# Patient Record
Sex: Female | Born: 1985 | Race: Black or African American | Hispanic: No | Marital: Single | Smoking: Current every day smoker
Health system: Southern US, Community
[De-identification: ages and names within clinical notes are randomized; demographics above are authoritative.]

## PROBLEM LIST (undated history)

## (undated) DIAGNOSIS — Z8673 Personal history of transient ischemic attack (TIA), and cerebral infarction without residual deficits: Secondary | ICD-10-CM

## (undated) DIAGNOSIS — I10 Essential (primary) hypertension: Secondary | ICD-10-CM

## (undated) DIAGNOSIS — M797 Fibromyalgia: Secondary | ICD-10-CM

## (undated) HISTORY — PX: NO PAST SURGERIES: SHX2092

---

## 2014-02-23 ENCOUNTER — Emergency Department (HOSPITAL_COMMUNITY): Payer: Medicaid - Out of State

## 2014-02-23 ENCOUNTER — Inpatient Hospital Stay (HOSPITAL_COMMUNITY)
Admission: EM | Admit: 2014-02-23 | Discharge: 2014-02-25 | DRG: 690 | Disposition: A | Discharge status: Home / Self Care | Payer: Medicaid - Out of State | Attending: Internal Medicine | Admitting: Internal Medicine

## 2014-02-23 ENCOUNTER — Encounter (HOSPITAL_COMMUNITY): Payer: Self-pay | Admitting: Emergency Medicine

## 2014-02-23 DIAGNOSIS — R1030 Lower abdominal pain, unspecified: Secondary | ICD-10-CM | POA: Diagnosis present

## 2014-02-23 DIAGNOSIS — Z975 Presence of (intrauterine) contraceptive device: Secondary | ICD-10-CM

## 2014-02-23 DIAGNOSIS — Z886 Allergy status to analgesic agent status: Secondary | ICD-10-CM

## 2014-02-23 DIAGNOSIS — F17210 Nicotine dependence, cigarettes, uncomplicated: Secondary | ICD-10-CM | POA: Diagnosis present

## 2014-02-23 DIAGNOSIS — R1031 Right lower quadrant pain: Secondary | ICD-10-CM

## 2014-02-23 DIAGNOSIS — I10 Essential (primary) hypertension: Secondary | ICD-10-CM | POA: Diagnosis present

## 2014-02-23 DIAGNOSIS — R109 Unspecified abdominal pain: Secondary | ICD-10-CM | POA: Diagnosis present

## 2014-02-23 DIAGNOSIS — A5611 Chlamydial female pelvic inflammatory disease: Principal | ICD-10-CM | POA: Diagnosis present

## 2014-02-23 DIAGNOSIS — Z23 Encounter for immunization: Secondary | ICD-10-CM

## 2014-02-23 DIAGNOSIS — Z79899 Other long term (current) drug therapy: Secondary | ICD-10-CM | POA: Diagnosis not present

## 2014-02-23 DIAGNOSIS — Z8249 Family history of ischemic heart disease and other diseases of the circulatory system: Secondary | ICD-10-CM | POA: Diagnosis not present

## 2014-02-23 DIAGNOSIS — N730 Acute parametritis and pelvic cellulitis: Secondary | ICD-10-CM | POA: Diagnosis present

## 2014-02-23 DIAGNOSIS — F1290 Cannabis use, unspecified, uncomplicated: Secondary | ICD-10-CM | POA: Diagnosis present

## 2014-02-23 HISTORY — DX: Essential (primary) hypertension: I10

## 2014-02-23 LAB — CBC WITH DIFFERENTIAL/PLATELET
BASOS ABS: 0 10*3/uL (ref 0.0–0.1)
BASOS PCT: 0 % (ref 0–1)
EOS ABS: 0 10*3/uL (ref 0.0–0.7)
Eosinophils Relative: 0 % (ref 0–5)
HCT: 38.5 % (ref 36.0–46.0)
HEMOGLOBIN: 12.6 g/dL (ref 12.0–15.0)
Lymphocytes Relative: 9 % — ABNORMAL LOW (ref 12–46)
Lymphs Abs: 1.2 10*3/uL (ref 0.7–4.0)
MCH: 29.9 pg (ref 26.0–34.0)
MCHC: 32.7 g/dL (ref 30.0–36.0)
MCV: 91.4 fL (ref 78.0–100.0)
MONO ABS: 0.4 10*3/uL (ref 0.1–1.0)
MONOS PCT: 3 % (ref 3–12)
Neutro Abs: 11.1 10*3/uL — ABNORMAL HIGH (ref 1.7–7.7)
Neutrophils Relative %: 88 % — ABNORMAL HIGH (ref 43–77)
Platelets: 264 10*3/uL (ref 150–400)
RBC: 4.21 MIL/uL (ref 3.87–5.11)
RDW: 13.5 % (ref 11.5–15.5)
WBC: 12.7 10*3/uL — ABNORMAL HIGH (ref 4.0–10.5)

## 2014-02-23 LAB — COMPREHENSIVE METABOLIC PANEL
ALBUMIN: 3.4 g/dL — AB (ref 3.5–5.2)
ALT: 9 U/L (ref 0–35)
AST: 13 U/L (ref 0–37)
Alkaline Phosphatase: 74 U/L (ref 39–117)
Anion gap: 13 (ref 5–15)
BUN: 11 mg/dL (ref 6–23)
CO2: 23 mEq/L (ref 19–32)
CREATININE: 0.74 mg/dL (ref 0.50–1.10)
Calcium: 9 mg/dL (ref 8.4–10.5)
Chloride: 102 mEq/L (ref 96–112)
GFR calc Af Amer: >90 mL/min (ref >90)
GFR calc non Af Amer: >90 mL/min (ref >90)
Glucose, Bld: 102 mg/dL — ABNORMAL HIGH (ref 70–99)
Potassium: 3.5 mEq/L — ABNORMAL LOW (ref 3.7–5.3)
Sodium: 138 mEq/L (ref 137–147)
TOTAL PROTEIN: 6.9 g/dL (ref 6.0–8.3)
Total Bilirubin: 1.3 mg/dL — ABNORMAL HIGH (ref 0.3–1.2)

## 2014-02-23 LAB — URINE MICROSCOPIC-ADD ON

## 2014-02-23 LAB — LIPASE, BLOOD: LIPASE: 11 U/L (ref 11–59)

## 2014-02-23 LAB — URINALYSIS, ROUTINE W REFLEX MICROSCOPIC
GLUCOSE, UA: NEGATIVE mg/dL
Hgb urine dipstick: NEGATIVE
KETONES UR: 40 mg/dL — AB
Nitrite: NEGATIVE
PROTEIN: 30 mg/dL — AB
Specific Gravity, Urine: 1.043 — ABNORMAL HIGH (ref 1.005–1.030)
UROBILINOGEN UA: 1 mg/dL (ref 0.0–1.0)
pH: 7 (ref 5.0–8.0)

## 2014-02-23 LAB — RAPID HIV SCREEN (WH-MAU): SUDS RAPID HIV SCREEN: NONREACTIVE

## 2014-02-23 LAB — WET PREP, GENITAL
TRICH WET PREP: NONE SEEN
Yeast Wet Prep HPF POC: NONE SEEN

## 2014-02-23 LAB — PREGNANCY, URINE: PREG TEST UR: NEGATIVE

## 2014-02-23 MED ORDER — SODIUM CHLORIDE 0.9 % IV BOLUS (SEPSIS)
1000.0000 mL | Freq: Once | INTRAVENOUS | Status: AC
  Administered 2014-02-23: 1000 mL via INTRAVENOUS

## 2014-02-23 MED ORDER — IOHEXOL 300 MG/ML  SOLN
100.0000 mL | Freq: Once | INTRAMUSCULAR | Status: AC | PRN
  Administered 2014-02-23: 100 mL via INTRAVENOUS

## 2014-02-23 MED ORDER — SODIUM CHLORIDE 0.9 % IV SOLN
3.0000 g | Freq: Three times a day (TID) | INTRAVENOUS | Status: DC
  Administered 2014-02-24: 3 g via INTRAVENOUS
  Filled 2014-02-23 (×2): qty 3

## 2014-02-23 MED ORDER — STERILE WATER FOR INJECTION IJ SOLN
INTRAMUSCULAR | Status: AC
  Administered 2014-02-23: 10 mL
  Filled 2014-02-23: qty 10

## 2014-02-23 MED ORDER — INFLUENZA VAC SPLIT QUAD 0.5 ML IM SUSY
0.5000 mL | PREFILLED_SYRINGE | INTRAMUSCULAR | Status: AC
  Administered 2014-02-25: 0.5 mL via INTRAMUSCULAR
  Filled 2014-02-23 (×3): qty 0.5

## 2014-02-23 MED ORDER — ONDANSETRON HCL 4 MG/2ML IJ SOLN: 4.0000 mg | Freq: Four times a day (QID) | INTRAMUSCULAR | Status: DC | PRN

## 2014-02-23 MED ORDER — ZOLPIDEM TARTRATE 5 MG PO TABS: 5.0000 mg | ORAL_TABLET | Freq: Every evening | ORAL | Status: DC | PRN

## 2014-02-23 MED ORDER — HYDROMORPHONE HCL 1 MG/ML IJ SOLN
1.0000 mg | INTRAMUSCULAR | Status: DC | PRN
  Administered 2014-02-23 – 2014-02-24 (×6): 1 mg via INTRAVENOUS
  Filled 2014-02-23 (×6): qty 1

## 2014-02-23 MED ORDER — ONDANSETRON HCL 4 MG PO TABS: 4.0000 mg | ORAL_TABLET | Freq: Four times a day (QID) | ORAL | Status: DC | PRN

## 2014-02-23 MED ORDER — CEFTRIAXONE SODIUM 250 MG IJ SOLR
250.0000 mg | Freq: Once | INTRAMUSCULAR | Status: AC
  Administered 2014-02-23: 250 mg via INTRAMUSCULAR
  Filled 2014-02-23: qty 250

## 2014-02-23 MED ORDER — DOXYCYCLINE HYCLATE 100 MG IV SOLR
100.0000 mg | Freq: Two times a day (BID) | INTRAVENOUS | Status: DC
  Administered 2014-02-24 – 2014-02-25 (×3): 100 mg via INTRAVENOUS
  Filled 2014-02-23 (×3): qty 100

## 2014-02-23 MED ORDER — ONDANSETRON HCL 4 MG/2ML IJ SOLN
4.0000 mg | Freq: Once | INTRAMUSCULAR | Status: AC
  Administered 2014-02-23: 4 mg via INTRAVENOUS
  Filled 2014-02-23: qty 2

## 2014-02-23 MED ORDER — HYDROMORPHONE HCL 1 MG/ML IJ SOLN
1.0000 mg | Freq: Once | INTRAMUSCULAR | Status: AC
  Administered 2014-02-23: 1 mg via INTRAVENOUS
  Filled 2014-02-23: qty 1

## 2014-02-23 MED ORDER — ENOXAPARIN SODIUM 40 MG/0.4ML ~~LOC~~ SOLN
40.0000 mg | Freq: Every day | SUBCUTANEOUS | Status: DC
  Administered 2014-02-24 (×2): 40 mg via SUBCUTANEOUS
  Filled 2014-02-23 (×3): qty 0.4

## 2014-02-23 MED ORDER — ENOXAPARIN SODIUM 40 MG/0.4ML ~~LOC~~ SOLN: 40.0000 mg | SUBCUTANEOUS | Status: DC

## 2014-02-23 MED ORDER — DOXYCYCLINE HYCLATE 100 MG PO TABS
100.0000 mg | ORAL_TABLET | Freq: Once | ORAL | Status: AC
  Administered 2014-02-23: 100 mg via ORAL
  Filled 2014-02-23: qty 1

## 2014-02-23 MED ORDER — MORPHINE SULFATE 2 MG/ML IJ SOLN
2.0000 mg | INTRAMUSCULAR | Status: DC | PRN
  Administered 2014-02-24 (×3): 2 mg via INTRAVENOUS
  Filled 2014-02-23 (×3): qty 1

## 2014-02-23 MED ORDER — SODIUM CHLORIDE 0.9 % IV SOLN
3.0000 g | Freq: Once | INTRAVENOUS | Status: AC
  Administered 2014-02-23: 3 g via INTRAVENOUS
  Filled 2014-02-23 (×2): qty 3

## 2014-02-23 MED ORDER — PNEUMOCOCCAL VAC POLYVALENT 25 MCG/0.5ML IJ INJ
0.5000 mL | INJECTION | INTRAMUSCULAR | Status: AC
  Administered 2014-02-25: 0.5 mL via INTRAMUSCULAR
  Filled 2014-02-23 (×3): qty 0.5

## 2014-02-23 MED ORDER — SODIUM CHLORIDE 0.9 % IV SOLN
INTRAVENOUS | Status: DC
  Administered 2014-02-24: 01:00:00 via INTRAVENOUS

## 2014-02-23 MED ORDER — METRONIDAZOLE 500 MG PO TABS
500.0000 mg | ORAL_TABLET | Freq: Once | ORAL | Status: AC
  Administered 2014-02-23: 500 mg via ORAL
  Filled 2014-02-23: qty 1

## 2014-02-23 MED ORDER — IOHEXOL 300 MG/ML  SOLN
50.0000 mL | Freq: Once | INTRAMUSCULAR | Status: AC | PRN
  Administered 2014-02-23: 50 mL via ORAL

## 2014-02-23 NOTE — Progress Notes (Addendum)
[facility]                                                                                                                                                                  H&P             Patient Demographics  Anna Horton, is a 28 y.o. female  CSN: [id number]  MRN: [id number]  DOB - 07-Dec-1985  Admit Date - 02/23/2014  Outpatient Primary MD for the patient is No primary provider on file.   With History of -  Past Medical History  Diagnosis Date  . Hypertension       Past Surgical History  Procedure Laterality Date  . No past surgeries      in for   Chief Complaint  Patient presents with  . Abdominal Pain     HPI  Anna Horton  is a 28 y.o. female, with past medical history significant for hypertension presenting with 2 days history of lower abdominal pain and fever . Patient denies any nausea vomiting or diarrhea. Patient denies any history vaginal discharge or PID lately . Patient has an IUD in place . She was evaluated by general surgery in the emergency room because of an abnormality noted around her appendix, and they advised admission for possible PID or evolving appendicitis. Patient received IV antibiotics in the emergency room and I was called to admit. Patient moved recently from [place] and she was treated in August for gastroenteritis there    Review of Systems    In addition to the HPI above,   No Headache, No changes with Vision or hearing, No problems swallowing food or Liquids, No Chest pain, Cough or Shortness of Breath,  No Nausea or Vommitting, Bowel movements are regular, No Blood in stool or Urine, No dysuria, No new skin rashes or bruises, No new joints pains-aches,  No new weakness, tingling, numbness in any extremity, No recent weight gain or loss, No polyuria, polydypsia or polyphagia, No significant Mental Stressors.  A full 10 point Review of Systems was done, except as stated above, all other Review of Systems  were negative.   Social History History  Substance Use Topics  . Smoking status: Current Every Day Smoker -- 0.50 packs/day for 10 years    Types: Cigarettes  . Smokeless tobacco: Never Used  . Alcohol Use: Yes     Comment: socially     Family History Significant for hypertension  Prior to Admission medications   Medication Sig Start Date End Date Taking? Authorizing Provider  acetaminophen (TYLENOL) 500 MG tablet Take 1,000 mg by mouth every 6 (six) hours as needed for mild pain or headache.   Yes Historical Provider, MD  levonorgestrel (MIRENA) 20 MCG/24HR IUD 1 each  by Intrauterine route once. October 2011   Yes Historical Provider, MD    Allergies  Allergen Reactions  . Tramadol Anaphylaxis    "makes my throat close"    Physical Exam  Vitals  Blood pressure 130/68, pulse 90, temperature 101.1 F (38.4 C), temperature source Oral, resp. rate 18, height 5\' 3"  (1.6 m), weight 63.504 kg (140 lb), last menstrual period 02/16/2014, SpO2 99.00%.   1. General Young female in pain  2. Normal affect and insight, Not Suicidal or Homicidal, Awake Alert, Oriented X 3.  3. No F.N deficits, ALL C.Nerves Intact,   4. Ears and Eyes appear Normal, Conjunctivae clear, PERRLA. Moist Oral Mucosa.  5. Supple Neck, No JVD, No cervical lymphadenopathy appriciated, No Carotid Bruits.  6. Symmetrical Chest wall movement, Good air movement bilaterally, CTAB.  7. RRR, No Gallops, Rubs or Murmurs, No Parasternal Heave.  8. Positive Bowel Sounds, lower abdominal tenderness with rebound both lower quadrants, no rigidity. No upper abdominal direct tenderness  9.  No Cyanosis, Normal Skin Turgor, No Skin Rash or Bruise.  10. Good muscle tone,  joints appear normal , no effusions, Normal ROM.  11. No Palpable Lymph Nodes in Neck or Axillae    Data Review  CBC  Recent Labs Lab 02/23/14 1535  WBC 12.7*  HGB 12.6  HCT 38.5  PLT 264  MCV 91.4  MCH 29.9  MCHC 32.7  RDW 13.5   LYMPHSABS 1.2  MONOABS 0.4  EOSABS 0.0  BASOSABS 0.0   ------------------------------------------------------------------------------------------------------------------  Chemistries   Recent Labs Lab 02/23/14 1535  NA 138  K 3.5*  CL 102  CO2 23  GLUCOSE 102*  BUN 11  CREATININE 0.74  CALCIUM 9.0  AST 13  ALT 9  ALKPHOS 74  BILITOT 1.3*   ------------------------------------------------------------------------------------------------------------------ estimated creatinine clearance is 95.6 ml/min (by C-G formula based on Cr of 0.74). ------------------------------------------------------------------------------------------------------------------ No results found for this basename: TSH, T4TOTAL, FREET3, T3FREE, THYROIDAB,  in the last 72 hours   Coagulation profile No results found for this basename: INR, PROTIME,  in the last 168 hours ------------------------------------------------------------------------------------------------------------------- No results found for this basename: DDIMER,  in the last 72 hours -------------------------------------------------------------------------------------------------------------------  Cardiac Enzymes No results found for this basename: CK, CKMB, TROPONINI, MYOGLOBIN,  in the last 168 hours ------------------------------------------------------------------------------------------------------------------ No components found with this basename: POCBNP,    ---------------------------------------------------------------------------------------------------------------  Urinalysis    Component Value Date/Time   COLORURINE ORANGE* 02/23/2014 1549   APPEARANCEUR CLEAR 02/23/2014 1549   LABSPEC 1.043* 02/23/2014 1549   PHURINE 7.0 02/23/2014 1549   GLUCOSEU NEGATIVE 02/23/2014 1549   HGBUR NEGATIVE 02/23/2014 1549   BILIRUBINUR SMALL* 02/23/2014 1549   KETONESUR 40* 02/23/2014 1549   PROTEINUR 30* 02/23/2014 1549   UROBILINOGEN  1.0 02/23/2014 1549   NITRITE NEGATIVE 02/23/2014 1549   LEUKOCYTESUR SMALL* 02/23/2014 1549    ----------------------------------------------------------------------------------------------------------------     Imaging results:   Ct Abdomen Pelvis W Contrast  02/23/2014   CLINICAL DATA:  Right lower quadrant pain for 2 days.  Dysuria.  EXAM: CT ABDOMEN AND PELVIS WITH CONTRAST  TECHNIQUE: Multidetector CT imaging of the abdomen and pelvis was performed using the standard protocol following bolus administration of intravenous contrast.  CONTRAST:  50mL OMNIPAQUE IOHEXOL 300 MG/ML SOLN, 100mL OMNIPAQUE IOHEXOL 300 MG/ML SOLN  COMPARISON:  None.  FINDINGS: The appendix is visualized and appears slightly enlarged with a diameter of 9.2 mm. However, there is no periappendiceal inflammation. The terminal ileum appears normal.  Liver, biliary tree, spleen, pancreas,  and left kidney are normal. In There is a 6 mm low-density lesion in the lateral aspect of the lower pole of the right kidney which probably represents small cyst but is too small to definitively characterize.  The ovaries are identified and appear normal. Tiny amount of free fluid in the pelvic cul-de-sac. IUD is in place in the uterus.  There is slight haziness in the peritoneal fat in the pelvis, nonspecific.  No acute osseous abnormality. 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  IMPRESSION: 1. Diameter of the proximal portion of the appendix is slightly prominent at 9.2 mm but there is no periappendiceal inflammation. 2. Slight haziness of the peritoneal fat in the pelvis, nonspecific. Tiny amount of free fluid in the pelvic cul-de-sac, normal for a female of this age.   Electronically Signed   By: Geanie Cooley M.D.   On: 02/23/2014 18:35      Assessment & Plan  1. lower abdominal pain, tenderness and fever    PID versus evolving appendicitis    Patient has an IUD placed in October 2012    Chandeliers  positive according to ER physician    General surgery following     Unasyn and doxycycline IV    N.p.o. after midnight    Consults OB/GYN in a.m.  2. History of hypertension     Continue with by mouth medications with sips of water   DVT Prophylaxis Lovenox  AM Labs Ordered, also please review Full Orders  Code Status full  Disposition Plan: Home  Time spent in minutes : 32 minutes  Condition GUARDED    @SIGNATURE @

## 2014-02-23 NOTE — Consult Note (Signed)
Reason for Consult: Lower abdominal pain Referring Physician: Dr. Christinia Gully Anna Horton is an 28 y.o. female.  HPI: she began having some lower abdominal pain during intercourse 2 days ago. The pain progressively worsened. It was crampy and now a sharp in nature. It seems to be most uncomfortable in the right lower quadrant area. She denies any vaginal discharge. She's had irregular periods and is currently menstruating. No diarrhea. No dysuria. No fever or chills. No loss of appetite. She presented to the emergency department for evaluation. During her pelvic exam, she is noted to have some friability of the cervix as well as tenderness in the lower abdomen on exam. CT scan demonstrated the base of the appendix to be approximately 9 mm which is slightly larger than normal. However, there were no inflammatory changes present. Because of the CT findings, I was asked to see her to evaluate her for possible acute appendicitis.  She is from [place]. She stated she was in the hospital for approximately a week back in August because of some gastroenteritis.  Past Medical History  Diagnosis Date  . Hypertension     No past surgical history on file.  No family history on file.  Social History:  reports that she has been smoking Cigarettes.  She has been smoking about 0.50 packs per day. She does not have any smokeless tobacco history on file. She reports that she drinks alcohol. She reports that she uses illicit drugs (Marijuana).  Allergies:  Allergies  Allergen Reactions  . Tramadol Anaphylaxis    "makes my throat close"    Prior to Admission medications   Medication Sig Start Date End Date Taking? Authorizing Provider  acetaminophen (TYLENOL) 500 MG tablet Take 1,000 mg by mouth every 6 (six) hours as needed for mild pain or headache.   Yes Historical Provider, MD  levonorgestrel (MIRENA) 20 MCG/24HR IUD 1 each by Intrauterine route once. October 2011   Yes Historical Provider, MD      Results for orders placed during the hospital encounter of 02/23/14 (from the past 48 hour(s))  CBC WITH DIFFERENTIAL     Status: Abnormal   Collection Time    02/23/14  3:35 PM      Result Value Ref Range   WBC 12.7 (*) 4.0 - 10.5 K/uL   RBC 4.21  3.87 - 5.11 MIL/uL   Hemoglobin 12.6  12.0 - 15.0 g/dL   HCT 38.5  36.0 - 46.0 %   MCV 91.4  78.0 - 100.0 fL   MCH 29.9  26.0 - 34.0 pg   MCHC 32.7  30.0 - 36.0 g/dL   RDW 13.5  11.5 - 15.5 %   Platelets 264  150 - 400 K/uL   Neutrophils Relative % 88 (*) 43 - 77 %   Neutro Abs 11.1 (*) 1.7 - 7.7 K/uL   Lymphocytes Relative 9 (*) 12 - 46 %   Lymphs Abs 1.2  0.7 - 4.0 K/uL   Monocytes Relative 3  3 - 12 %   Monocytes Absolute 0.4  0.1 - 1.0 K/uL   Eosinophils Relative 0  0 - 5 %   Eosinophils Absolute 0.0  0.0 - 0.7 K/uL   Basophils Relative 0  0 - 1 %   Basophils Absolute 0.0  0.0 - 0.1 K/uL  COMPREHENSIVE METABOLIC PANEL     Status: Abnormal   Collection Time    02/23/14  3:35 PM      Result Value Ref Range  Sodium 138  137 - 147 mEq/L   Potassium 3.5 (*) 3.7 - 5.3 mEq/L   Chloride 102  96 - 112 mEq/L   CO2 23  19 - 32 mEq/L   Glucose, Bld 102 (*) 70 - 99 mg/dL   BUN 11  6 - 23 mg/dL   Creatinine, Ser 0.74  0.50 - 1.10 mg/dL   Calcium 9.0  8.4 - 10.5 mg/dL   Total Protein 6.9  6.0 - 8.3 g/dL   Albumin 3.4 (*) 3.5 - 5.2 g/dL   AST 13  0 - 37 U/L   ALT 9  0 - 35 U/L   Alkaline Phosphatase 74  39 - 117 U/L   Total Bilirubin 1.3 (*) 0.3 - 1.2 mg/dL   GFR calc non Af Amer >90  >90 mL/min   GFR calc Af Amer >90  >90 mL/min   Comment: (NOTE)     The eGFR has been calculated using the CKD EPI equation.     This calculation has not been validated in all clinical situations.     eGFR's persistently <90 mL/min signify possible Chronic Kidney     Disease.   Anion gap 13  5 - 15  LIPASE, BLOOD     Status: None   Collection Time    02/23/14  3:35 PM      Result Value Ref Range   Lipase 11  11 - 59 U/L  RAPID HIV SCREEN  [facility])     Status: None   Collection Time    02/23/14  3:35 PM      Result Value Ref Range   SUDS Rapid HIV Screen NON REACTIVE  NON REACTIVE   Comment: RESULT CALLED TO, READ BACK BY AND VERIFIED WITH:     HAMBY MARTHA RN 1937 02/23/14 [place]  URINALYSIS, ROUTINE W REFLEX MICROSCOPIC     Status: Abnormal   Collection Time    02/23/14  3:49 PM      Result Value Ref Range   Color, Urine ORANGE (*) YELLOW   Comment: BIOCHEMICALS MAY BE AFFECTED BY COLOR   APPearance CLEAR  CLEAR   Specific Gravity, Urine 1.043 (*) 1.005 - 1.030   pH 7.0  5.0 - 8.0   Glucose, UA NEGATIVE  NEGATIVE mg/dL   Hgb urine dipstick NEGATIVE  NEGATIVE   Bilirubin Urine SMALL (*) NEGATIVE   Ketones, ur 40 (*) NEGATIVE mg/dL   Protein, ur 30 (*) NEGATIVE mg/dL   Urobilinogen, UA 1.0  0.0 - 1.0 mg/dL   Nitrite NEGATIVE  NEGATIVE   Leukocytes, UA SMALL (*) NEGATIVE  PREGNANCY, URINE     Status: None   Collection Time    02/23/14  3:49 PM      Result Value Ref Range   Preg Test, Ur NEGATIVE  NEGATIVE   Comment:            THE SENSITIVITY OF THIS     METHODOLOGY IS >20 mIU/mL.  URINE MICROSCOPIC-ADD ON     Status: Abnormal   Collection Time    02/23/14  3:49 PM      Result Value Ref Range   Squamous Epithelial / LPF FEW (*) RARE   WBC, UA 0-2  <3 WBC/hpf   Bacteria, UA RARE  RARE   Casts HYALINE CASTS (*) NEGATIVE   Urine-Other MUCOUS PRESENT    WET PREP, GENITAL     Status: Abnormal   Collection Time    02/23/14  4:13 PM  Result Value Ref Range   Yeast Wet Prep HPF POC NONE SEEN  NONE SEEN   Trich, Wet Prep NONE SEEN  NONE SEEN   Clue Cells Wet Prep HPF POC MODERATE (*) NONE SEEN   WBC, Wet Prep HPF POC MODERATE (*) NONE SEEN    Ct Abdomen Pelvis W Contrast  02/23/2014   CLINICAL DATA:  Right lower quadrant pain for 2 days.  Dysuria.  EXAM: CT ABDOMEN AND PELVIS WITH CONTRAST  TECHNIQUE: Multidetector CT imaging of the abdomen and pelvis was performed using the standard protocol  following bolus administration of intravenous contrast.  CONTRAST:  73mL OMNIPAQUE IOHEXOL 300 MG/ML SOLN, 189mL OMNIPAQUE IOHEXOL 300 MG/ML SOLN  COMPARISON:  None.  FINDINGS: The appendix is visualized and appears slightly enlarged with a diameter of 9.2 mm. However, there is no periappendiceal inflammation. The terminal ileum appears normal.  Liver, biliary tree, spleen, pancreas, and left kidney are normal. In There is a 6 mm low-density lesion in the lateral aspect of the lower pole of the right kidney which probably represents small cyst but is too small to definitively characterize.  The ovaries are identified and appear normal. Tiny amount of free fluid in the pelvic cul-de-sac. IUD is in place in the uterus.  There is slight haziness in the peritoneal fat in the pelvis, nonspecific.  No acute osseous abnormality. 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  IMPRESSION: 1. Diameter of the proximal portion of the appendix is slightly prominent at 9.2 mm but there is no periappendiceal inflammation. 2. Slight haziness of the peritoneal fat in the pelvis, nonspecific. Tiny amount of free fluid in the pelvic cul-de-sac, normal for a female of this age.   Electronically Signed   By: Rozetta Nunnery M.D.   On: 02/23/2014 18:35    Review of Systems  Constitutional: Negative for fever and chills.  HENT: Negative for congestion.   Respiratory: Negative.   Gastrointestinal: Positive for abdominal pain. Negative for nausea, vomiting and diarrhea.  Genitourinary: Negative for dysuria.   Blood pressure 130/68, pulse 90, temperature 101.1 F (38.4 C), temperature source Oral, resp. rate 18, height $RemoveBe'5\' 3"'[id number]$  (1.6 m), weight 140 lb (63.504 kg), last menstrual period 02/16/2014, SpO2 99.00%. Physical Exam  Constitutional:  Uncomfortable appearing female.  HENT:  Head: Normocephalic and atraumatic.  Eyes: No scleral icterus.  Neck: Neck supple.  Cardiovascular:  Increased rate.   Respiratory: Effort normal.  GI: Soft. Bowel sounds are normal. She exhibits no mass. There is tenderness (Throughout lower abdomen to palpation and percussion).  Musculoskeletal: She exhibits no edema.  Lymphadenopathy:    She has no cervical adenopathy.  Neurological: She is alert.  Skin: Skin is warm and dry.  Psychiatric: She has a normal mood and affect. Her behavior is normal.    Assessment/Plan: Lower abdominal pain following intercourse. CT scan demonstrates slight dilation of the proximal appendix with no inflammatory findings. Her clinical history and exam are more consistent with pelvic inflammatory disease rather than acute appendicitis although cannot completely rule appendicitis.  Recommendation: She should be admitted to the hospital and started on appropriate antibiotic therapy for pelvic inflammatory disease. If her condition does not improve or gets worse despite this, I would recommend a diagnostic laparoscopy and possible appendectomy.  Kloey Cazarez J 02/23/2014, 9:03 PM

## 2014-02-23 NOTE — ED Notes (Addendum)
Pt c/o RLQ pain x last 2 days.  Denies N/V/D.  States she had chills last night. States she had intercourse 2 days ago and not sure if her IUD has moved.  Also c/o dysuria but "not like a UTI, it's like peeing over a cut"   Pt guards abdomen and walks hunched over

## 2014-02-23 NOTE — ED Provider Notes (Signed)
CSN: [id number]     Arrival date & time 02/23/14  1422 History   First MD Initiated Contact with Patient 02/23/14 1459     Chief Complaint  Patient presents with  . Abdominal Pain     (Consider location/radiation/quality/duration/timing/severity/associated sxs/prior Treatment) Patient is a 27 y.o. female presenting with abdominal pain.  Abdominal Pain Associated symptoms: vaginal bleeding   Associated symptoms: no chest pain, no dysuria, no fever, no nausea, no shortness of breath, no vaginal discharge and no vomiting    Ms. Eddleman is a 28 year old female past medical history of hypertension who presents the ER with abdominal pain. Patient states her pain began gradually 2 days ago. Patient states she first noticed her pain after having intercourse 2 days ago. Patient states the pain has persisted since then, has been constant, is alleviated with lying in fetal position, is worsened with movement, walking. When asked patient locates her pain in her right lower quadrant of her abdomen diffusely. Patient denies associated nausea, vomiting, diarrhea, fever, dysuria. Patient reports dyspareunia 2 nights ago. Patient reports having 3 different sexual partners over the past month. Patient reports some mild vaginal bleeding, which she states is typical for her since she has had an IUD placed. Patient denies any vaginal discharge. Past Medical History  Diagnosis Date  . Hypertension    Past Surgical History  Procedure Laterality Date  . No past surgeries     History reviewed. No pertinent family history. History  Substance Use Topics  . Smoking status: Current Every Day Smoker -- 0.50 packs/day for 10 years    Types: Cigarettes  . Smokeless tobacco: Never Used  . Alcohol Use: Yes     Comment: socially   OB History   Grav Para Term Preterm Abortions TAB SAB Ect Mult Living                 Review of Systems  Constitutional: Negative for fever.  HENT: Negative for trouble swallowing.    Eyes: Negative for visual disturbance.  Respiratory: Negative for shortness of breath.   Cardiovascular: Negative for chest pain.  Gastrointestinal: Positive for abdominal pain. Negative for nausea and vomiting.  Genitourinary: Positive for vaginal bleeding, vaginal pain and dyspareunia. Negative for dysuria, vaginal discharge and difficulty urinating.  Musculoskeletal: Negative for neck pain.  Skin: Negative for rash.  Neurological: Negative for dizziness, weakness and numbness.  Psychiatric/Behavioral: Negative.       Allergies  Tramadol  Home Medications   Prior to Admission medications   Medication Sig Start Date End Date Taking? Authorizing Provider  acetaminophen (TYLENOL) 500 MG tablet Take 1,000 mg by mouth every 6 (six) hours as needed for mild pain or headache.   Yes Historical Provider, MD  levonorgestrel (MIRENA) 20 MCG/24HR IUD 1 each by Intrauterine route once. October 2011   Yes Historical Provider, MD   BP 149/97  Pulse 113  Temp(Src) 100.1 F (37.8 C) (Oral)  Resp 18  Ht 5\' 3"  (1.6 m)  Wt 140 lb (63.504 kg)  BMI 24.81 kg/m2  SpO2 99%  LMP 02/16/2014 Physical Exam  Nursing note and vitals reviewed. Constitutional: She appears well-developed and well-nourished. She appears distressed.  Patient in mild to moderate distress from her pain.  HENT:  Head: Normocephalic and atraumatic.  Mouth/Throat: Oropharynx is clear and moist. No oropharyngeal exudate.  Eyes: EOM are normal. Pupils are equal, round, and reactive to light. Right eye exhibits no discharge. Left eye exhibits no discharge. No scleral icterus.  Neck: Normal  range of motion.  Cardiovascular: Regular rhythm, S1 normal, S2 normal and normal heart sounds.  Tachycardia present.   No murmur heard. Patient tachycardic at 110 on exam.  Pulmonary/Chest: Effort normal and breath sounds normal. No respiratory distress.  Abdominal: Soft. Normal appearance and bowel sounds are normal. There is tenderness in  the right lower quadrant. There is guarding and tenderness at McBurney's point. There is no rigidity, no rebound and negative Murphy's sign.  Genitourinary: Pelvic exam was performed with patient supine. There is no tenderness, lesion or injury on the right labia. There is no tenderness, lesion or injury on the left labia. Cervix exhibits motion tenderness, discharge and friability. Right adnexum displays tenderness. Right adnexum displays no mass and no fullness. Left adnexum displays no mass, no tenderness and no fullness. There is tenderness around the vagina. No erythema or bleeding around the vagina. No foreign body around the vagina. Vaginal discharge found.  Mild cervical motion tenderness, moderate amount of right adnexal tenderness. Cervical friability and yellow colored cervical discharge noted. 2 dark-colored strings noted with approximately 1 cm in length from closed cervical os. Chaperone present during entire pelvic exam.  Musculoskeletal: Normal range of motion. She exhibits no edema and no tenderness.  Neurological: She has normal strength. No cranial nerve deficit or sensory deficit. She displays a negative Romberg sign. Coordination normal. GCS eye subscore is 4. GCS verbal subscore is 5. GCS motor subscore is 6.  Patient fully alert answering questions appropriately in full, clear sentences.  Skin: Skin is warm and dry. No rash noted. She is not diaphoretic.  Psychiatric: She has a normal mood and affect.    ED Course  Procedures (including critical care time) Labs Review Labs Reviewed  WET PREP, GENITAL - Abnormal; Notable for the following:    Clue Cells Wet Prep HPF POC MODERATE (*)    WBC, Wet Prep HPF POC MODERATE (*)    All other components within normal limits  URINALYSIS, ROUTINE W REFLEX MICROSCOPIC - Abnormal; Notable for the following:    Color, Urine ORANGE (*)    Specific Gravity, Urine 1.043 (*)    Bilirubin Urine SMALL (*)    Ketones, ur 40 (*)    Protein, ur  30 (*)    Leukocytes, UA SMALL (*)    All other components within normal limits  CBC WITH DIFFERENTIAL - Abnormal; Notable for the following:    WBC 12.7 (*)    Neutrophils Relative % 88 (*)    Neutro Abs 11.1 (*)    Lymphocytes Relative 9 (*)    All other components within normal limits  COMPREHENSIVE METABOLIC PANEL - Abnormal; Notable for the following:    Potassium 3.5 (*)    Glucose, Bld 102 (*)    Albumin 3.4 (*)    Total Bilirubin 1.3 (*)    All other components within normal limits  URINE MICROSCOPIC-ADD ON - Abnormal; Notable for the following:    Squamous Epithelial / LPF FEW (*)    Casts HYALINE CASTS (*)    All other components within normal limits  GC/CHLAMYDIA PROBE AMP  PREGNANCY, URINE  LIPASE, BLOOD  RAPID HIV SCREEN ([facility])  BASIC METABOLIC PANEL  CBC    Imaging Review Ct Abdomen Pelvis W Contrast  02/23/2014   CLINICAL DATA:  Right lower quadrant pain for 2 days.  Dysuria.  EXAM: CT ABDOMEN AND PELVIS WITH CONTRAST  TECHNIQUE: Multidetector CT imaging of the abdomen and pelvis was performed using the standard protocol following  bolus administration of intravenous contrast.  CONTRAST:  50mL OMNIPAQUE IOHEXOL 300 MG/ML SOLN, OMNIPAQUE IOHEXOL 300 MG/ML SOLN  COMPARISON:  None.  FINDINGS: The appendix is visualized and appears slightly enlarged with a diameter of 9.2 mm. However, there is no periappendiceal inflammation. The terminal ileum appears normal.  Liver, biliary tree, spleen, pancreas, and left kidney are normal. In There is a 6 mm low-density lesion in the lateral aspect of the lower pole of the right kidney which probably represents small cyst but is too small to definitively characterize.  The ovaries are identified and appear normal. Tiny amount of free fluid in the pelvic cul-de-sac. IUD is in place in the uterus.  There is slight haziness in the peritoneal fat in the pelvis, nonspecific.  No acute osseous abnormality. 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  IMPRESSION: 1. Diameter of the proximal portion of the appendix is slightly prominent at 9.2 mm but there is no periappendiceal inflammation. 2. Slight haziness of the peritoneal fat in the pelvis, nonspecific. Tiny amount of free fluid in the pelvic cul-de-sac, normal for a female of this age.   Electronically Signed   By: Geanie Cooley M.D.   On: 02/23/2014 18:35     EKG Interpretation None      MDM   Final diagnoses:  Right lower quadrant abdominal pain    Patient of right lower quadrant pain x2 days status post intercourse, with mild vaginal discharge/bleeding with right lower quadrant abdominal pain. Pelvic exam remarkable for signs of PID with cervical friability and discharge. With patient's abdomen exquisitely tender in right lower quadrant, will follow with CT abdomen pelvis for rule out of tubo-ovarian abscess and appendicitis.  CT abdomen pelvis returns with equivocal findings of possible appendicitis. Patient has leukocytosis of 12.7, Consult placed to surgery.  Surgery recommends that due to patient's exquisite tenderness in her right lower quadrant, equivocal CT scan, patient is nonoperative at this time, however should be admitted for observation. We will place consult to medicine, for admission for observation.The patient appears reasonably stabilized for admission considering the current resources, flow, and capabilities available in the ED at this time, and I doubt any other [facility] requiring further screening and/or treatment in the ED prior to admission.  BP 149/97  Pulse 113  Temp(Src) 100.1 F (37.8 C) (Oral)  Resp 18  Ht 5\' 3"  (1.6 m)  Wt 140 lb (63.504 kg)  BMI 24.81 kg/m2  SpO2 99%  LMP 02/16/2014  Signed,  Ladona Mow, PA-C 2:53 AM  This patient seen and discussed with Dr. Azalia Bilis, M.D.  Monte Fantasia, PA-C 02/24/14 [phone]

## 2014-02-23 NOTE — ED Notes (Signed)
Pt will give urine sample with finished with PA exam.

## 2014-02-24 DIAGNOSIS — I10 Essential (primary) hypertension: Secondary | ICD-10-CM

## 2014-02-24 DIAGNOSIS — N730 Acute parametritis and pelvic cellulitis: Secondary | ICD-10-CM | POA: Diagnosis present

## 2014-02-24 LAB — CBC
HCT: 35.6 % — ABNORMAL LOW (ref 36.0–46.0)
HEMOGLOBIN: 11.9 g/dL — AB (ref 12.0–15.0)
MCH: 30.6 pg (ref 26.0–34.0)
MCHC: 33.4 g/dL (ref 30.0–36.0)
MCV: 91.5 fL (ref 78.0–100.0)
PLATELETS: 221 10*3/uL (ref 150–400)
RBC: 3.89 MIL/uL (ref 3.87–5.11)
RDW: 13.5 % (ref 11.5–15.5)
WBC: 12.6 10*3/uL — AB (ref 4.0–10.5)

## 2014-02-24 LAB — BASIC METABOLIC PANEL
Anion gap: 13 (ref 5–15)
BUN: 6 mg/dL (ref 6–23)
CHLORIDE: 103 meq/L (ref 96–112)
CO2: 21 mEq/L (ref 19–32)
Calcium: 8.5 mg/dL (ref 8.4–10.5)
Creatinine, Ser: 0.62 mg/dL (ref 0.50–1.10)
GFR calc Af Amer: >90 mL/min (ref >90)
GLUCOSE: 85 mg/dL (ref 70–99)
POTASSIUM: 3.6 meq/L — AB (ref 3.7–5.3)
SODIUM: 137 meq/L (ref 137–147)

## 2014-02-24 LAB — GC/CHLAMYDIA PROBE AMP
CT Probe RNA: POSITIVE — AB
GC Probe RNA: POSITIVE — AB

## 2014-02-24 MED ORDER — KETOROLAC TROMETHAMINE 30 MG/ML IJ SOLN
30.0000 mg | Freq: Once | INTRAMUSCULAR | Status: AC
  Administered 2014-02-24: 30 mg via INTRAVENOUS
  Filled 2014-02-24: qty 1

## 2014-02-24 MED ORDER — CEFAZOLIN SODIUM-DEXTROSE 2-3 GM-% IV SOLR
2.0000 g | Freq: Four times a day (QID) | INTRAVENOUS | Status: DC
  Filled 2014-02-24 (×2): qty 50

## 2014-02-24 MED ORDER — LISINOPRIL 5 MG PO TABS
5.0000 mg | ORAL_TABLET | Freq: Every day | ORAL | Status: DC
  Administered 2014-02-24 (×2): 5 mg via ORAL
  Filled 2014-02-24 (×2): qty 1

## 2014-02-24 MED ORDER — MORPHINE SULFATE 2 MG/ML IJ SOLN
2.0000 mg | INTRAMUSCULAR | Status: AC | PRN
  Administered 2014-02-25 (×2): 2 mg via INTRAVENOUS
  Filled 2014-02-24 (×2): qty 1

## 2014-02-24 MED ORDER — DEXTROSE 5 % IV SOLN
2.0000 g | Freq: Four times a day (QID) | INTRAVENOUS | Status: DC
  Administered 2014-02-24 – 2014-02-25 (×4): 2 g via INTRAVENOUS
  Filled 2014-02-24 (×6): qty 2

## 2014-02-24 NOTE — ED Provider Notes (Signed)
Medical screening examination/treatment/procedure(s) were conducted as a shared visit with non-physician practitioner(s) and myself.  I personally evaluated the patient during the encounter.   EKG Interpretation None      Patient with PID versus early appendicitis.  Given her degree of discomfort we had general surgery combativeness the patient given her abnormal CT.  He is more convinced this is PID.  Patient will be admitted to the hospitalist with IV antibiotics for treatment of PID.  General surgery will follow.  Lyanne CoKevin M Girolamo Lortie, MD 02/24/14 [phone]

## 2014-02-24 NOTE — Progress Notes (Signed)
PROGRESS NOTE  Anna LimesJerika Tuch ZOX:[id number] DOB: Apr 10, 1986 DOA: 02/23/2014 PCP: No primary provider on file.  HPI: Anna Horton is a 28 y.o. female, with past medical history significant for hypertension presenting with 2 days history of lower abdominal pain and fever  Subjective/ 24 H Interval events - continues to have abdominal pain this morning, she is not sure whether she feels better - insists that she goes home tomorrow  Assessment/Plan: Lower abdominal pain - likely due to PID, GC/Chlamydia positive - start PID treatment with Cefoxitin and Doxycycline  - case discussed with Dr. Katrinka Bl[facility]Smith from [facility] and no need to remove IUD for now, use Iv Abx until her pain is improving and can go home to complete the Doxycycline.  - surgery consulted and evaluated patient for concern for evolving appendicitis, less likely given more evidence of PID, they signed off today - advance diet  HTN - start Lisinopril  Diet: Full liquid Fluids: NS DVT Prophylaxis: Lovenox  Code Status: Full Family Communication: d/w patient  Disposition Plan: inpatient, home when ready   Consultants:  Surgery   [facility]yn over the phone  Procedures:  None    Antibiotics Unasyn 10/6 >> 10/7 Cefoxitin 10/7 >> Doxycycline 10/6 >>  Studies  Filed Vitals:   02/23/14 1841 02/23/14 2007 02/23/14 2330 02/24/14 0619  BP: 130/68  149/97 156/102  Pulse: 90  113 102  Temp: 98.5 F (36.9 C) 101.1 F (38.4 C) 100.1 F (37.8 C) 98.9 F (37.2 C)  TempSrc: Oral Oral Oral Oral  Resp: 18  18 18   Height:      Weight:      SpO2: 99%  99% 98%    Intake/Output Summary (Last 24 hours) at 02/24/14 0807 Last data filed at 02/24/14 0629  Gross per 24 hour  Intake 923.33 ml  Output      0 ml  Net 923.33 ml   Filed Weights   02/23/14 1441  Weight: 63.504 kg (140 lb)    Exam:  General:  NAD  Cardiovascular: RRR  Respiratory: CTA biL  Abdomen: soft, mild tenderness RLQ  MSK: no edema  Data  Reviewed: Basic Metabolic Panel:  Recent Labs Lab 02/23/14 1535 02/24/14 0515  NA 138 137  K 3.5* 3.6*  CL 102 103  CO2 23 21  GLUCOSE 102* 85  BUN 11 6  CREATININE 0.74 0.62  CALCIUM 9.0 8.5   Liver Function Tests:  Recent Labs Lab 02/23/14 1535  AST 13  ALT 9  ALKPHOS 74  BILITOT 1.3*  PROT 6.9  ALBUMIN 3.4*    Recent Labs Lab 02/23/14 1535  LIPASE 11   CBC:  Recent Labs Lab 02/23/14 1535 02/24/14 0515  WBC 12.7* 12.6*  NEUTROABS 11.1*  --   HGB 12.6 11.9*  HCT 38.5 35.6*  MCV 91.4 91.5  PLT 264 221    Recent Results (from the past 240 hour(s))  WET PREP, GENITAL     Status: Abnormal   Collection Time    02/23/14  4:13 PM      Result Value Ref Range Status   Yeast Wet Prep HPF POC NONE SEEN  NONE SEEN Final   Trich, Wet Prep NONE SEEN  NONE SEEN Final   Clue Cells Wet Prep HPF POC MODERATE (*) NONE SEEN Final   WBC, Wet Prep HPF POC MODERATE (*) NONE SEEN Final     Studies: Ct Abdomen Pelvis W Contrast 02/23/2014 1. Diameter of the proximal portion of the appendix is slightly prominent  at 9.2 mm but there is no periappendiceal inflammation. 2. Slight haziness of the peritoneal fat in the pelvis, nonspecific. Tiny amount of free fluid in the pelvic cul-de-sac, normal for a female of this age.   Scheduled Meds: . ampicillin-sulbactam (UNASYN) IV  3 g Intravenous Q8H  . doxycycline (VIBRAMYCIN) IV  100 mg Intravenous Q12H  . enoxaparin (LOVENOX) injection  40 mg Subcutaneous QHS  . Influenza vac split quadrivalent PF  0.5 mL Intramuscular Tomorrow-1000  . pneumococcal 23 valent vaccine  0.5 mL Intramuscular Tomorrow-1000   Continuous Infusions: . sodium chloride 100 mL/hr at 02/24/14 0045    Active Problems:   Abdominal pain   Lower abdominal pain   PID (acute pelvic inflammatory disease)   HTN (hypertension)   Time spent: 25  Pamella Pert, MD [facility] Pager [phone]. If 7 PM - 7 AM, please contact night-coverage at  [email or web address], password [facility] 02/24/2014, 8:07 AM  LOS: 1 day

## 2014-02-24 NOTE — Progress Notes (Signed)
Patient ID: Anna Horton, female   DOB: 07-22-1985, 28 y.o.   MRN: [id number]     [address] [place], [place] [address]    Phone: [phone] FAX: [phone]     Subjective: Denies n/v or anorexia.  Denies diarrhea.  WBC essentially unchanged.  Afebrile.  Pain is somewhat worse, not localized.   Objective:  Vital signs:  Filed Vitals:   02/23/14 1841 02/23/14 2007 02/23/14 2330 02/24/14 0619  BP: 130/68  149/97 156/102  Pulse: 90  113 102  Temp: 98.5 F (36.9 C) 101.1 F (38.4 C) 100.1 F (37.8 C) 98.9 F (37.2 C)  TempSrc: Oral Oral Oral Oral  Resp: _0 Height:      Weight:      SpO2: 99%  99% 98%       Intake/Output   Yesterday:  10/06 0701 - 10/07 0700 In: 923.3 [I.V.:573.3; IV Piggyback:350] Out: -  This shift:     Physical Exam: General: Pt awake/alert/oriented x4 in no acute distress Abdomen: Soft.  Nondistended.  TTP lower abdomen.  No evidence of peritonitis.  No incarcerated hernias.   Problem List:   Active Problems:   Abdominal pain   Lower abdominal pain    Results:   Labs: Results for orders placed during the hospital encounter of 02/23/14 (from the past 48 hour(s))  CBC WITH DIFFERENTIAL     Status: Abnormal   Collection Time    02/23/14  3:35 PM      Result Value Ref Range   WBC 12.7 (*) 4.0 - 10.5 K/uL   RBC 4.21  3.87 - 5.11 MIL/uL   Hemoglobin 12.6  12.0 - 15.0 g/dL   HCT 38.5  36.0 - 46.0 %   MCV 91.4  78.0 - 100.0 fL   MCH 29.9  26.0 - 34.0 pg   MCHC 32.7  30.0 - 36.0 g/dL   RDW 13.5  11.5 - 15.5 %   Platelets 264  150 - 400 K/uL   Neutrophils Relative % 88 (*) 43 - 77 %   Neutro Abs 11.1 (*) 1.7 - 7.7 K/uL   Lymphocytes Relative 9 (*) 12 - 46 %   Lymphs Abs 1.2  0.7 - 4.0 K/uL   Monocytes Relative 3  3 - 12 %   Monocytes Absolute 0.4  0.1 - 1.0 K/uL   Eosinophils Relative 0  0 - 5 %   Eosinophils Absolute 0.0  0.0 - 0.7 K/uL   Basophils  Relative 0  0 - 1 %   Basophils Absolute 0.0  0.0 - 0.1 K/uL  COMPREHENSIVE METABOLIC PANEL     Status: Abnormal   Collection Time    02/23/14  3:35 PM      Result Value Ref Range   Sodium 138  137 - 147 mEq/L   Potassium 3.5 (*) 3.7 - 5.3 mEq/L   Chloride 102  96 - 112 mEq/L   CO2 23  19 - 32 mEq/L   Glucose, Bld 102 (*) 70 - 99 mg/dL   BUN 11  6 - 23 mg/dL   Creatinine, Ser 0.74  0.50 - 1.10 mg/dL   Calcium 9.0  8.4 - 10.5 mg/dL   Total Protein 6.9  6.0 - 8.3 g/dL   Albumin 3.4 (*) 3.5 - 5.2 g/dL   AST 13  0 - 37 U/L   ALT 9  0 - 35 U/L  Alkaline Phosphatase 74  39 - 117 U/L   Total Bilirubin 1.3 (*) 0.3 - 1.2 mg/dL   GFR calc non Af Amer >90  >90 mL/min   GFR calc Af Amer >90  >90 mL/min   Comment: (NOTE)     The eGFR has been calculated using the CKD EPI equation.     This calculation has not been validated in all clinical situations.     eGFR's persistently <90 mL/min signify possible Chronic Kidney     Disease.   Anion gap 13  5 - 15  LIPASE, BLOOD     Status: None   Collection Time    02/23/14  3:35 PM      Result Value Ref Range   Lipase 11  11 - 59 U/L  RAPID HIV SCREEN [facility])     Status: None   Collection Time    02/23/14  3:35 PM      Result Value Ref Range   SUDS Rapid HIV Screen NON REACTIVE  NON REACTIVE   Comment: RESULT CALLED TO, READ BACK BY AND VERIFIED WITH:     HAMBY MARTHA RN [id number] 02/23/14 [place] V  URINALYSIS, ROUTINE W REFLEX MICROSCOPIC     Status: Abnormal   Collection Time    02/23/14  3:49 PM      Result Value Ref Range   Color, Urine ORANGE (*) YELLOW   Comment: BIOCHEMICALS MAY BE AFFECTED BY COLOR   APPearance CLEAR  CLEAR   Specific Gravity, Urine 1.043 (*) 1.005 - 1.030   pH 7.0  5.0 - 8.0   Glucose, UA NEGATIVE  NEGATIVE mg/dL   Hgb urine dipstick NEGATIVE  NEGATIVE   Bilirubin Urine SMALL (*) NEGATIVE   Ketones, ur 40 (*) NEGATIVE mg/dL   Protein, ur 30 (*) NEGATIVE mg/dL   Urobilinogen, UA 1.0  0.0 - 1.0 mg/dL   Nitrite  NEGATIVE  NEGATIVE   Leukocytes, UA SMALL (*) NEGATIVE  PREGNANCY, URINE     Status: None   Collection Time    02/23/14  3:49 PM      Result Value Ref Range   Preg Test, Ur NEGATIVE  NEGATIVE   Comment:            THE SENSITIVITY OF THIS     METHODOLOGY IS >20 mIU/mL.  URINE MICROSCOPIC-ADD ON     Status: Abnormal   Collection Time    02/23/14  3:49 PM      Result Value Ref Range   Squamous Epithelial / LPF FEW (*) RARE   WBC, UA 0-2  <3 WBC/hpf   Bacteria, UA RARE  RARE   Casts HYALINE CASTS (*) NEGATIVE   Urine-Other MUCOUS PRESENT    WET PREP, GENITAL     Status: Abnormal   Collection Time    02/23/14  4:13 PM      Result Value Ref Range   Yeast Wet Prep HPF POC NONE SEEN  NONE SEEN   Trich, Wet Prep NONE SEEN  NONE SEEN   Clue Cells Wet Prep HPF POC MODERATE (*) NONE SEEN   WBC, Wet Prep HPF POC MODERATE (*) NONE SEEN  GC/CHLAMYDIA PROBE AMP     Status: Abnormal   Collection Time    02/23/14  4:13 PM      Result Value Ref Range   CT Probe RNA POSITIVE (*) NEGATIVE   Comment: (NOTE)     A Positive CT or NG Nucleic Acid Amplification Test (NAAT) result  should be considered presumptive evidence of infection.  The result     should be evaluated along with physical examination and other     diagnostic findings.   GC Probe RNA POSITIVE (*) NEGATIVE   Comment: (NOTE)     A Positive CT or NG Nucleic Acid Amplification Test (NAAT) result     should be considered presumptive evidence of infection.  The result     should be evaluated along with physical examination and other     diagnostic findings.                                                                                               **Normal Reference Range: Negative**          Assay performed using the Gen-Probe APTIMA COMBO2 (R) Assay.     Acceptable specimen types for this assay include APTIMA Swabs (Unisex,     endocervical, urethral, or vaginal), first void urine, and ThinPrep     liquid based cytology  samples.     Performed at [facility]     Status: Abnormal   Collection Time    02/24/14  5:15 AM      Result Value Ref Range   Sodium 137  137 - 147 mEq/L   Potassium 3.6 (*) 3.7 - 5.3 mEq/L   Chloride 103  96 - 112 mEq/L   CO2 21  19 - 32 mEq/L   Glucose, Bld 85  70 - 99 mg/dL   BUN 6  6 - 23 mg/dL   Creatinine, Ser 0.62  0.50 - 1.10 mg/dL   Calcium 8.5  8.4 - 10.5 mg/dL   GFR calc non Af Amer >90  >90 mL/min   GFR calc Af Amer >90  >90 mL/min   Comment: (NOTE)     The eGFR has been calculated using the CKD EPI equation.     This calculation has not been validated in all clinical situations.     eGFR's persistently <90 mL/min signify possible Chronic Kidney     Disease.   Anion gap 13  5 - 15  CBC     Status: Abnormal   Collection Time    02/24/14  5:15 AM      Result Value Ref Range   WBC 12.6 (*) 4.0 - 10.5 K/uL   RBC 3.89  3.87 - 5.11 MIL/uL   Hemoglobin 11.9 (*) 12.0 - 15.0 g/dL   HCT 35.6 (*) 36.0 - 46.0 %   MCV 91.5  78.0 - 100.0 fL   MCH 30.6  26.0 - 34.0 pg   MCHC 33.4  30.0 - 36.0 g/dL   RDW 13.5  11.5 - 15.5 %   Platelets 221  150 - 400 K/uL    Imaging / Studies: Ct Abdomen Pelvis W Contrast  02/23/2014   CLINICAL DATA:  Right lower quadrant pain for 2 days.  Dysuria.  EXAM: CT ABDOMEN AND PELVIS WITH CONTRAST  TECHNIQUE: Multidetector CT imaging of the abdomen and pelvis was performed using the standard protocol following bolus administration of intravenous contrast.  CONTRAST:  56m OMNIPAQUE IOHEXOL 300 MG/ML SOLN, 1031mOMNIPAQUE IOHEXOL 300 MG/ML SOLN  COMPARISON:  None.  FINDINGS: The appendix is visualized and appears slightly enlarged with a diameter of 9.2 mm. However, there is no periappendiceal inflammation. The terminal ileum appears normal.  Liver, biliary tree, spleen, pancreas, and left kidney are normal. In There is a 6 mm low-density lesion in the lateral aspect of the lower pole of the right kidney which probably  represents small cyst but is too small to definitively characterize.  The ovaries are identified and appear normal. Tiny amount of free fluid in the pelvic cul-de-sac. IUD is in place in the uterus.  There is slight haziness in the peritoneal fat in the pelvis, nonspecific.  No acute osseous abnormality. 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  IMPRESSION: 1. Diameter of the proximal portion of the appendix is slightly prominent at 9.2 mm but there is no periappendiceal inflammation. 2. Slight haziness of the peritoneal fat in the pelvis, nonspecific. Tiny amount of free fluid in the pelvic cul-de-sac, normal for a female of this age.   Electronically Signed   By: JiRozetta Nunnery.D.   On: 02/23/2014 18:35    Medications / Allergies:  Scheduled Meds: . cefOXitin  2 g Intravenous 4 times per day  . doxycycline (VIBRAMYCIN) IV  100 mg Intravenous Q12H  . enoxaparin (LOVENOX) injection  40 mg Subcutaneous QHS  . Influenza vac split quadrivalent PF  0.5 mL Intramuscular Tomorrow-1000  . pneumococcal 23 valent vaccine  0.5 mL Intramuscular Tomorrow-1000   Continuous Infusions: . sodium chloride 100 mL/hr at 02/24/14 0045   PRN Meds:.HYDROmorphone (DILAUDID) injection, morphine injection, ondansetron (ZOFRAN) IV, ondansetron, zolpidem  Antibiotics: Anti-infectives   Start     Dose/Rate Route Frequency Ordered Stop   02/24/14 1200  ceFAZolin (ANCEF) IVPB 2 g/50 mL premix  Status:  Discontinued     2 g 100 mL/hr over 30 Minutes Intravenous 4 times per day 02/24/14 0917 02/24/14 0929   02/24/14 1200  cefOXitin (MEFOXIN) 2 g in dextrose 5 % 50 mL IVPB     2 g 100 mL/hr over 30 Minutes Intravenous 4 times per day 02/24/14 0929     02/24/14 0600  Ampicillin-Sulbactam (UNASYN) 3 g in sodium chloride 0.9 % 100 mL IVPB  Status:  Discontinued     3 g 100 mL/hr over 60 Minutes Intravenous Every 8 hours 02/23/14 2337 02/24/14 0917   02/24/14 0600  doxycycline (VIBRAMYCIN)  100 mg in dextrose 5 % 250 mL IVPB     100 mg 125 mL/hr over 120 Minutes Intravenous Every 12 hours 02/23/14 2337     02/23/14 2115  Ampicillin-Sulbactam (UNASYN) 3 g in sodium chloride 0.9 % 100 mL IVPB     3 g 100 mL/hr over 60 Minutes Intravenous  Once 02/23/14 2108 02/23/14 2310   02/23/14 1730  metroNIDAZOLE (FLAGYL) tablet 500 mg     500 mg Oral  Once 02/23/14 1719 02/23/14 1754   02/23/14 1730  doxycycline (VIBRA-TABS) tablet 100 mg     100 mg Oral  Once 02/23/14 1719 02/23/14 1754   02/23/14 1630  cefTRIAXone (ROCEPHIN) injection 250 mg     250 mg Intramuscular  Once 02/23/14 1621 02/23/14 1730        Assessment/Plan Lower abdominal pain +GC/chlamydia  Low suspicion for acute appendicitis.  Treat for STD, primary team have consulted GYN.  May start PO from surgical standpoint.  Will  sign off.  Please call [facility] with questions or concerns.   Erby Pian, [facility] Surgery Pager [phone])  02/24/2014 11:14 AM  Agree with above. Complex patient with a lot of "medical" issues.  Has fibromyalgia, neuropathy of lower extremities, HTN, and history of mini strokes.  She is seeking disability. She had PID about 10 years ago. She is from [place], moved to [place] about one month ago, but supposed to move out of house in [place] this week.  In [place] with friend.  Alphonsa Overall, MD, [facility] Surgery Pager: [phone] Office phone:  [phone]

## 2014-02-24 NOTE — Care Management Note (Signed)
    Page 1 of 1   02/24/2014     2:32:25 PM CARE MANAGEMENT NOTE 02/24/2014  Patient:  Malva LimesBARBER,[place]ianna   Account Number:  [id number]  Date Initiated:  02/24/2014  Documentation initiated by:  Lorenda IshiharaPEELE,Snyder Colavito  Subjective/Objective Assessment:   28 yo female admitted with RLQ abd pain. PTA lived at home with mother.     Action/Plan:   Home when stable   Anticipated DC Date:  02/27/2014   Anticipated DC Plan:  HOME/SELF CARE      DC Planning Services  CM consult      Choice offered to / List presented to:             Status of service:  Completed, signed off Medicare Important Message given?   (If response is "NO", the following Medicare IM given date fields will be blank) Date Medicare IM given:   Medicare IM given by:   Date Additional Medicare IM given:   Additional Medicare IM given by:    Discharge Disposition:  HOME/SELF CARE  Per UR Regulation:  Reviewed for med. necessity/level of care/duration of stay  If discussed at Long Length of Stay Meetings, dates discussed:    Comments:

## 2014-02-25 MED ORDER — LISINOPRIL 10 MG PO TABS
10.0000 mg | ORAL_TABLET | Freq: Every day | ORAL | Status: DC
  Administered 2014-02-25: 10 mg via ORAL
  Filled 2014-02-25: qty 1

## 2014-02-25 MED ORDER — DOXYCYCLINE HYCLATE 100 MG PO TABS
100.0000 mg | ORAL_TABLET | Freq: Two times a day (BID) | ORAL | Status: DC
Start: 2014-02-25 — End: 2014-04-30

## 2014-02-25 MED ORDER — HYDRALAZINE HCL 20 MG/ML IJ SOLN
10.0000 mg | Freq: Four times a day (QID) | INTRAMUSCULAR | Status: DC | PRN
  Administered 2014-02-25 (×2): 10 mg via INTRAVENOUS
  Filled 2014-02-25 (×2): qty 1

## 2014-02-25 MED ORDER — LISINOPRIL 10 MG PO TABS: 10.0000 mg | ORAL_TABLET | Freq: Every day | ORAL | Status: AC

## 2014-02-25 MED ORDER — DOXYCYCLINE HYCLATE 100 MG PO TABS
100.0000 mg | ORAL_TABLET | Freq: Two times a day (BID) | ORAL | Status: DC
  Administered 2014-02-25: 100 mg via ORAL
  Filled 2014-02-25 (×2): qty 1

## 2014-02-25 MED ORDER — MORPHINE SULFATE 2 MG/ML IJ SOLN
2.0000 mg | INTRAMUSCULAR | Status: DC | PRN
  Administered 2014-02-25: 2 mg via INTRAVENOUS
  Filled 2014-02-25: qty 1

## 2014-02-25 MED ORDER — HYDROCODONE-ACETAMINOPHEN 5-325 MG PO TABS: 1.0000 | ORAL_TABLET | Freq: Four times a day (QID) | ORAL | Status: DC | PRN

## 2014-02-25 NOTE — Progress Notes (Signed)
Patient discharged home, alert and oriented, discharge instructions given, patient verbalize understanding of discharge instructions given, My Chart access declined at this time, patient in stable condition at this time 

## 2014-02-25 NOTE — Discharge Summary (Signed)
Physician Discharge Summary  Anna Horton ZOX:[id number] DOB: 1985-11-04 DOA: 02/23/2014  PCP: No primary provider on file.  Admit date: 02/23/2014 Discharge date: 02/25/2014  Time spent: 35 minutes  Recommendations for Outpatient Follow-up:  1. Follow up with PCP in [place] in 1 week   Discharge Diagnoses:  Active Problems:   Abdominal pain   Lower abdominal pain   PID (acute pelvic inflammatory disease)   HTN (hypertension)   Discharge Condition: stable  Diet recommendation: regular   Filed Weights   02/23/14 1441  Weight: 63.504 kg (140 lb)    History of present illness:  Anna Horton is a 28 y.o. female, with past medical history significant for hypertension presenting with 2 days history of lower abdominal pain and fever . Patient denies any nausea vomiting or diarrhea. Patient denies any history vaginal discharge or PID lately . Patient has an IUD in place . She was evaluated by general surgery in the emergency room because of an abnormality noted around her appendix, and they advised admission for possible PID or evolving appendicitis. Patient received IV antibiotics in the emergency room and I was called to admit. Patient moved recently from [place] and she was treated in August for gastroenteritis there  Hospital Course:  Lower abdominal pain due to PID - GC/Chlamydia positive  - start PID treatment with Cefoxitin and Doxycycline, case discussed with Dr. Katrinka Blazing from [place] and no need to remove IUD for now, used IV Abx until her pain is improving and can go home to complete the Doxycycline. By 10/8 patient's abdominal pain has improved significantly and she was able to tolerate regular food without further nausea or discomfort. She will be discharged home to complete a total of 14 days of Doxycycline. She was advised to follow up with her PCP in [place] prior to finishing her antibiotics. She expressed udnerstanding.  - surgery consulted and evaluated patient for  concern for evolving appendicitis, less likely given more evidence of PID, they signed off  HTN - started Lisinopril with somewhat of a better control, advised close follow up with PCP as an outpatient.   Procedures:  None    Consultations:  General Surgery   ObGyn over the phone   Discharge Exam: Filed Vitals:   02/25/14 0006 02/25/14 0300 02/25/14 0700 02/25/14 1011  BP: 184/108 181/111 163/109 174/114  Pulse:  102 108 113  Temp:  98.6 F (37 C) 98.8 F (37.1 C) 98.8 F (37.1 C)  TempSrc:  Oral Oral Oral  Resp:  16 18 18   Height:      Weight:      SpO2:  100% 100% 100%    General: NAD Cardiovascular: RRR Respiratory: CTA biL  Discharge Instructions     Medication List         acetaminophen 500 MG tablet  Commonly known as:  TYLENOL  Take 1,000 mg by mouth every 6 (six) hours as needed for mild pain or headache.     doxycycline 100 MG tablet  Commonly known as:  VIBRA-TABS  Take 1 tablet (100 mg total) by mouth every 12 (twelve) hours.     HYDROcodone-acetaminophen 5-325 MG per tablet  Commonly known as:  NORCO  Take 1 tablet by mouth every 6 (six) hours as needed for moderate pain.     levonorgestrel 20 MCG/24HR IUD  Commonly known as:  MIRENA  1 each by Intrauterine route once. October 2011     lisinopril 10 MG tablet  Commonly known  as:  PRINIVIL,ZESTRIL  Take 1 tablet (10 mg total) by mouth daily.           Follow-up Information   Follow up with [facility]. Call on 02/24/2014.   Contact information:   [address] [place] [address] [phone]      The results of significant diagnostics from this hospitalization (including imaging, microbiology, ancillary and laboratory) are listed below for reference.    Significant Diagnostic Studies: Ct Abdomen Pelvis W Contrast  02/23/2014   CLINICAL DATA:  Right lower quadrant pain for 2 days.  Dysuria.  EXAM: CT ABDOMEN AND PELVIS WITH CONTRAST  TECHNIQUE: Multidetector CT  imaging of the abdomen and pelvis was performed using the standard protocol following bolus administration of intravenous contrast.  CONTRAST:  50mL OMNIPAQUE IOHEXOL 300 MG/ML SOLN, OMNIPAQUE IOHEXOL 300 MG/ML SOLN  COMPARISON:  None.  FINDINGS: The appendix is visualized and appears slightly enlarged with a diameter of 9.2 mm. However, there is no periappendiceal inflammation. The terminal ileum appears normal.  Liver, biliary tree, spleen, pancreas, and left kidney are normal. In There is a 6 mm low-density lesion in the lateral aspect of the lower pole of the right kidney which probably represents small cyst but is too small to definitively characterize.  The ovaries are identified and appear normal. Tiny amount of free fluid in the pelvic cul-de-sac. IUD is in place in the uterus.  There is slight haziness in the peritoneal fat in the pelvis, nonspecific.  No acute osseous abnormality. 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  IMPRESSION: 1. Diameter of the proximal portion of the appendix is slightly prominent at 9.2 mm but there is no periappendiceal inflammation. 2. Slight haziness of the peritoneal fat in the pelvis, nonspecific. Tiny amount of free fluid in the pelvic cul-de-sac, normal for a female of this age.   Electronically Signed   By: Geanie Cooley M.D.   On: 02/23/2014 18:35    Microbiology: Recent Results (from the past 240 hour(s))  WET PREP, GENITAL     Status: Abnormal   Collection Time    02/23/14  4:13 PM      Result Value Ref Range Status   Yeast Wet Prep HPF POC NONE SEEN  NONE SEEN Final   Trich, Wet Prep NONE SEEN  NONE SEEN Final   Clue Cells Wet Prep HPF POC MODERATE (*) NONE SEEN Final   WBC, Wet Prep HPF POC MODERATE (*) NONE SEEN Final  GC/CHLAMYDIA PROBE AMP     Status: Abnormal   Collection Time    02/23/14  4:13 PM      Result Value Ref Range Status   CT Probe RNA POSITIVE (*) NEGATIVE Final   Comment: (NOTE)     A Positive  CT or NG Nucleic Acid Amplification Test (NAAT) result     should be considered presumptive evidence of infection.  The result     should be evaluated along with physical examination and other     diagnostic findings.   GC Probe RNA POSITIVE (*) NEGATIVE Final   Comment: (NOTE)     A Positive CT or NG Nucleic Acid Amplification Test (NAAT) result     should be considered presumptive evidence of infection.  The result     should be evaluated along with physical examination and other     diagnostic findings.                                                                                               **  Normal Reference Range: Negative**          Assay performed using the Gen-Probe APTIMA COMBO2 (R) Assay.     Acceptable specimen types for this assay include APTIMA Swabs (Unisex,     endocervical, urethral, or vaginal), first void urine, and ThinPrep     liquid based cytology samples.     Performed at [facility]     Labs: Basic Metabolic Panel:  Recent Labs Lab 02/23/14 1535 02/24/14 0515  NA 138 137  K 3.5* 3.6*  CL 102 103  CO2 23 21  GLUCOSE 102* 85  BUN 11 6  CREATININE 0.74 0.62  CALCIUM 9.0 8.5   Liver Function Tests:  Recent Labs Lab 02/23/14 1535  AST 13  ALT 9  ALKPHOS 74  BILITOT 1.3*  PROT 6.9  ALBUMIN 3.4*    Recent Labs Lab 02/23/14 1535  LIPASE 11   CBC:  Recent Labs Lab 02/23/14 1535 02/24/14 0515  WBC 12.7* 12.6*  NEUTROABS 11.1*  --   HGB 12.6 11.9*  HCT 38.5 35.6*  MCV 91.4 91.5  PLT 264 221    Signed:  Kealani Leckey  [facility] 02/25/2014, 4:59 PM

## 2014-02-25 NOTE — Discharge Instructions (Signed)
You were cared for by a hospitalist during your hospital stay. If you have any questions about your discharge medications or the care you received while you were in the hospital after you are discharged, you can call the unit and asked to speak with the hospitalist on call if the hospitalist that took care of you is not available. Once you are discharged, your primary care physician will handle any further medical issues. Please note that NO REFILLS for any discharge medications will be authorized once you are discharged, as it is imperative that you return to your primary care physician (or establish a relationship with a primary care physician if you do not have one) for your aftercare needs so that they can reassess your need for medications and monitor your lab values. °  °  °If you do not have a primary care physician, you can call [phone] for a physician referral. ° °Follow with Primary MD in 5-7 days  ° °Get CBC, CMP checked by your doctor and again as further instructed.  °Get a 2 view Chest X ray done next visit if you had Pneumonia of Lung problems at the Hospital. ° °Get Medicines reviewed and adjusted. ° °Please request your Prim.MD to go over all Hospital Tests and Procedure/Radiological results at the follow up, please get all Hospital records sent to your Prim MD by signing hospital release before you go home. ° °Activity: As tolerated with Full fall precautions use walker/cane & assistance as needed ° °Diet: regular ° °For Heart failure patients - Check your Weight same time everyday, if you gain over 2 pounds, or you develop in leg swelling, experience more shortness of breath or chest pain, call your Primary MD immediately. Follow Cardiac Low Salt Diet and 1.8 lit/day fluid restriction. ° °Disposition Home ° °If you experience worsening of your admission symptoms, develop shortness of breath, life threatening emergency, suicidal or homicidal thoughts you must seek medical attention immediately by  calling 911 or calling your MD immediately  if symptoms less severe. ° °You Must read complete instructions/literature along with all the possible adverse reactions/side effects for all the Medicines you take and that have been prescribed to you. Take any new Medicines after you have completely understood and accpet all the possible adverse reactions/side effects.  ° °Do not drive and provide baby sitting services if your were admitted for syncope or siezures until you have seen by Primary MD or a Neurologist and advised to do so again. ° °Do not drive when taking Pain medications.  ° °Do not take more than prescribed Pain, Sleep and Anxiety Medications ° °Special Instructions: If you have smoked or chewed Tobacco  in the last 2 yrs please stop smoking, stop any regular Alcohol  and or any Recreational drug use. ° °Wear Seat belts while driving. ° °

## 2014-03-26 ENCOUNTER — Telehealth (HOSPITAL_BASED_OUTPATIENT_CLINIC_OR_DEPARTMENT_OTHER): Payer: Self-pay | Admitting: Emergency Medicine

## 2014-04-17 ENCOUNTER — Encounter (HOSPITAL_BASED_OUTPATIENT_CLINIC_OR_DEPARTMENT_OTHER): Payer: Self-pay | Admitting: *Deleted

## 2014-04-17 ENCOUNTER — Emergency Department (HOSPITAL_BASED_OUTPATIENT_CLINIC_OR_DEPARTMENT_OTHER)
Admission: EM | Admit: 2014-04-17 | Discharge: 2014-04-17 | Disposition: A | Discharge status: Home / Self Care | Payer: Medicaid - Out of State | Attending: Emergency Medicine | Admitting: Emergency Medicine

## 2014-04-17 ENCOUNTER — Emergency Department (HOSPITAL_BASED_OUTPATIENT_CLINIC_OR_DEPARTMENT_OTHER): Payer: Medicaid - Out of State

## 2014-04-17 DIAGNOSIS — R51 Headache: Secondary | ICD-10-CM | POA: Diagnosis not present

## 2014-04-17 DIAGNOSIS — Z3202 Encounter for pregnancy test, result negative: Secondary | ICD-10-CM | POA: Diagnosis not present

## 2014-04-17 DIAGNOSIS — R519 Headache, unspecified: Secondary | ICD-10-CM

## 2014-04-17 DIAGNOSIS — Z720 Tobacco use: Secondary | ICD-10-CM | POA: Insufficient documentation

## 2014-04-17 DIAGNOSIS — R079 Chest pain, unspecified: Secondary | ICD-10-CM | POA: Diagnosis present

## 2014-04-17 DIAGNOSIS — I10 Essential (primary) hypertension: Secondary | ICD-10-CM | POA: Diagnosis not present

## 2014-04-17 LAB — BASIC METABOLIC PANEL
Anion gap: 16 — ABNORMAL HIGH (ref 5–15)
BUN: 6 mg/dL (ref 6–23)
CALCIUM: 9.2 mg/dL (ref 8.4–10.5)
CHLORIDE: 104 meq/L (ref 96–112)
CO2: 20 mEq/L (ref 19–32)
Creatinine, Ser: 0.7 mg/dL (ref 0.50–1.10)
GFR calc Af Amer: >90 mL/min (ref >90)
GFR calc non Af Amer: >90 mL/min (ref >90)
GLUCOSE: 87 mg/dL (ref 70–99)
Potassium: 3.5 mEq/L — ABNORMAL LOW (ref 3.7–5.3)
SODIUM: 140 meq/L (ref 137–147)

## 2014-04-17 LAB — URINE MICROSCOPIC-ADD ON

## 2014-04-17 LAB — CBC WITH DIFFERENTIAL/PLATELET
Basophils Absolute: 0 10*3/uL (ref 0.0–0.1)
Basophils Relative: 0 % (ref 0–1)
EOS PCT: 2 % (ref 0–5)
Eosinophils Absolute: 0.1 10*3/uL (ref 0.0–0.7)
HEMATOCRIT: 42.6 % (ref 36.0–46.0)
Hemoglobin: 14.7 g/dL (ref 12.0–15.0)
LYMPHS ABS: 1.2 10*3/uL (ref 0.7–4.0)
LYMPHS PCT: 23 % (ref 12–46)
MCH: 30.6 pg (ref 26.0–34.0)
MCHC: 34.5 g/dL (ref 30.0–36.0)
MCV: 88.8 fL (ref 78.0–100.0)
Monocytes Absolute: 0.4 10*3/uL (ref 0.1–1.0)
Monocytes Relative: 7 % (ref 3–12)
NEUTROS ABS: 3.6 10*3/uL (ref 1.7–7.7)
Neutrophils Relative %: 68 % (ref 43–77)
Platelets: 295 10*3/uL (ref 150–400)
RBC: 4.8 MIL/uL (ref 3.87–5.11)
RDW: 12.2 % (ref 11.5–15.5)
WBC: 5.3 10*3/uL (ref 4.0–10.5)

## 2014-04-17 LAB — URINALYSIS, ROUTINE W REFLEX MICROSCOPIC
Bilirubin Urine: NEGATIVE
GLUCOSE, UA: NEGATIVE mg/dL
KETONES UR: 15 mg/dL — AB
Nitrite: POSITIVE — AB
PROTEIN: NEGATIVE mg/dL
Specific Gravity, Urine: 1.014 (ref 1.005–1.030)
UROBILINOGEN UA: 1 mg/dL (ref 0.0–1.0)
pH: 6.5 (ref 5.0–8.0)

## 2014-04-17 LAB — PRO B NATRIURETIC PEPTIDE: PRO B NATRI PEPTIDE: 59 pg/mL (ref 0–125)

## 2014-04-17 LAB — TROPONIN I

## 2014-04-17 LAB — PREGNANCY, URINE: PREG TEST UR: NEGATIVE

## 2014-04-17 MED ORDER — METRONIDAZOLE 500 MG PO TABS
2000.0000 mg | ORAL_TABLET | Freq: Once | ORAL | Status: AC
  Administered 2014-04-17: 2000 mg via ORAL
  Filled 2014-04-17: qty 4

## 2014-04-17 MED ORDER — LISINOPRIL 10 MG PO TABS: 10.0000 mg | ORAL_TABLET | Freq: Every day | ORAL | Status: AC

## 2014-04-17 MED ORDER — METOCLOPRAMIDE HCL 5 MG/ML IJ SOLN
10.0000 mg | Freq: Once | INTRAMUSCULAR | Status: AC
  Administered 2014-04-17: 10 mg via INTRAVENOUS
  Filled 2014-04-17: qty 2

## 2014-04-17 MED ORDER — DIPHENHYDRAMINE HCL 50 MG/ML IJ SOLN
25.0000 mg | Freq: Once | INTRAMUSCULAR | Status: AC
  Administered 2014-04-17: 25 mg via INTRAVENOUS
  Filled 2014-04-17: qty 1

## 2014-04-17 MED ORDER — CEPHALEXIN 500 MG PO CAPS: 500.0000 mg | ORAL_CAPSULE | Freq: Four times a day (QID) | ORAL | Status: DC

## 2014-04-17 MED ORDER — SODIUM CHLORIDE 0.9 % IV BOLUS (SEPSIS)
1000.0000 mL | Freq: Once | INTRAVENOUS | Status: AC
Start: 2014-04-17 — End: 2014-04-17
  Administered 2014-04-17: 1000 mL via INTRAVENOUS

## 2014-04-17 MED ORDER — LISINOPRIL 10 MG PO TABS
10.0000 mg | ORAL_TABLET | Freq: Once | ORAL | Status: AC
  Administered 2014-04-17: 10 mg via ORAL
  Filled 2014-04-17: qty 1

## 2014-04-17 NOTE — ED Provider Notes (Signed)
CSN: [id number]     Arrival date & time 04/17/14  1708 History   First MD Initiated Contact with Patient 04/17/14 1807     Chief Complaint  Patient presents with  . Chest Pain   (Consider location/radiation/quality/duration/timing/severity/associated sxs/prior Treatment) HPI  Anna Horton is a 28 yo female presenting with report of a headache x 4 days.  She reports the headache began gradually across her forehead and makes her face hurt.  She associates these headaches with her high blood pressure.  She reports the headache has not improved but she was able to do her normal daily activities.  She reports last night she felt a sharp pain in her chest.  She describes her headache as a tightness across her forehead and her face and rates it as 10/10.  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  She is prescribed lisinopril, metoprolol, HCTZ and procardia but has not been on any of her prescription meds for 4-5 months.  She denies blurred vision, shortness of breath, focal weakness, dysuria, abd pain, nausea, or vomiting.    Past Medical History  Diagnosis Date  . Hypertension    Past Surgical History  Procedure Laterality Date  . No past surgeries     No family history on file. History  Substance Use Topics  . Smoking status: Current Every Day Smoker -- 0.50 packs/day for 10 years    Types: Cigarettes  . Smokeless tobacco: Never Used  . Alcohol Use: Yes     Comment: socially   OB History    No data available     Review of Systems  Constitutional: Negative for fever and chills.  HENT: Negative for sore throat.   Eyes: Negative for visual disturbance.  Respiratory: Negative for cough and shortness of breath.   Cardiovascular: Positive for chest pain. Negative for leg swelling.  Gastrointestinal: Negative for nausea, vomiting and diarrhea.  Genitourinary: Negative for dysuria.  Musculoskeletal: Negative for myalgias.  Skin: Negative  for rash.  Neurological: Positive for headaches. Negative for dizziness, weakness and numbness.    Allergies  Tramadol  Home Medications   Prior to Admission medications   Medication Sig Start Date End Date Taking? Authorizing Provider  acetaminophen (TYLENOL) 500 MG tablet Take 1,000 mg by mouth every 6 (six) hours as needed for mild pain or headache.    Historical Provider, MD  doxycycline (VIBRA-TABS) 100 MG tablet Take 1 tablet (100 mg total) by mouth every 12 (twelve) hours. 02/25/14   Costin Otelia SergeantM Gherghe, MD  HYDROcodone-acetaminophen (NORCO) 5-325 MG per tablet Take 1 tablet by mouth every 6 (six) hours as needed for moderate pain. 02/25/14   Costin Otelia SergeantM Gherghe, MD  levonorgestrel (MIRENA) 20 MCG/24HR IUD 1 each by Intrauterine route once. October 2011    Historical Provider, MD  lisinopril (PRINIVIL,ZESTRIL) 10 MG tablet Take 1 tablet (10 mg total) by mouth daily. 02/25/14   Costin Otelia SergeantM Gherghe, MD   BP 210/130 mmHg  Pulse 110  Temp(Src) 97.8 F (36.6 C) (Oral)  Resp 18  Ht 5\' 3"  (1.6 m)  Wt 150 lb (68.04 kg)  BMI 26.58 kg/m2  SpO2 100% Physical Exam  Constitutional: She is oriented to person, place, and time. She appears well-developed and well-nourished. No distress.  HENT:  Head: Normocephalic and atraumatic.  Mouth/Throat: Oropharynx is clear and moist. No oropharyngeal exudate.  Eyes: Conjunctivae are normal. Pupils are equal, round, and reactive to light.  Neck: Neck supple.  No thyromegaly present.  Cardiovascular: Normal rate, regular rhythm and intact distal pulses.  Exam reveals no gallop and no friction rub.   No murmur heard. Pulmonary/Chest: Effort normal and breath sounds normal. No respiratory distress. She has no wheezes. She has no rales. She exhibits tenderness.    Abdominal: Soft. There is no tenderness.  Musculoskeletal: She exhibits no tenderness.  Lymphadenopathy:    She has no cervical adenopathy.  Neurological: She is alert and oriented to person, place,  and time. She has normal strength. No cranial nerve deficit or sensory deficit. Coordination normal. GCS eye subscore is 4. GCS verbal subscore is 5. GCS motor subscore is 6.  Reflex Scores:      Patellar reflexes are 2+ on the right side and 2+ on the left side. Cranial nerves 2-12 intact  Skin: Skin is warm and dry. No rash noted. She is not diaphoretic.  Psychiatric: She has a normal mood and affect.  Nursing note and vitals reviewed.   ED Course  Procedures (including critical care time) Labs Review Labs Reviewed  BASIC METABOLIC PANEL - Abnormal; Notable for the following:    Potassium 3.5 (*)    Anion gap 16 (*)    All other components within normal limits  URINALYSIS, ROUTINE W REFLEX MICROSCOPIC - Abnormal; Notable for the following:    APPearance CLOUDY (*)    Hgb urine dipstick LARGE (*)    Ketones, ur 15 (*)    Nitrite POSITIVE (*)    Leukocytes, UA MODERATE (*)    All other components within normal limits  URINE MICROSCOPIC-ADD ON - Abnormal; Notable for the following:    Squamous Epithelial / LPF MANY (*)    Bacteria, UA MANY (*)    All other components within normal limits  URINE CULTURE  CBC WITH DIFFERENTIAL  TROPONIN I  PRO B NATRIURETIC PEPTIDE  PREGNANCY, URINE    Imaging Review Dg Chest 2 View  04/17/2014   CLINICAL DATA:  Chest pain starting last night  EXAM: CHEST  2 VIEW  COMPARISON:  None.  FINDINGS: The heart size and mediastinal contours are within normal limits. Both lungs are clear. The visualized skeletal structures are unremarkable.  IMPRESSION: No active cardiopulmonary disease.   Electronically Signed   By: Natasha MeadLiviu  Pop M.D.   On: 04/17/2014 19:51   Ct Head Wo Contrast  04/17/2014   CLINICAL DATA:  Headache.  Uncontrolled hypertension.  EXAM: CT HEAD WITHOUT CONTRAST  TECHNIQUE: Contiguous axial images were obtained from the base of the skull through the vertex without intravenous contrast.  COMPARISON:  None.  FINDINGS: Normal appearing  cerebral hemispheres and posterior fossa structures. Normal size and position of the ventricles. No intracranial hemorrhage, mass lesion or CT evidence of acute infarction. Unremarkable bones and included paranasal sinuses. Prominent bilateral dural calcifications.  IMPRESSION: No acute abnormality.   Electronically Signed   By: Gordan PaymentSteve  Reid M.D.   On: 04/17/2014 19:34     EKG Interpretation   Date/Time:  Saturday April 17 2014 17:12:59 EST Ventricular Rate:  102 PR Interval:  162 QRS Duration: 82 QT Interval:  350 QTC Calculation: 456 R Axis:   76 Text Interpretation:  Sinus tachycardia Right atrial enlargement  Borderline ECG Confirmed by Lincoln Brighamees, Liz [phone]([id number]) on 04/17/2014 6:07:30 PM      MDM   Final diagnoses:  Chest pain  Headache   28 yo female with report of headache and intermittent chest pains.  Her headache is like her usual presentation and  was treated and improved while in ED.  I doubt her chest pain is related to a cardiac or pulmonary etiology as her EKG, CXR and troponin are all negative.  A head CT was done because of the pt's very high blood pressure and was also negative.  Pt has been off any treatment for her blood pressure for several months because of insurance coverage.  There was a incidental finding of UTI and trichomoniasis in pt's urine.  Abx provided to treat.  Pt is afebrile with no focal neuro deficits, nuchal rigidity, or change in vision. Resources provided to establish care with a PCP and prescription for lisinopril. Pt verbalizes understanding and is agreeable with plan to dc. Return precautions provided.   Filed Vitals:   04/17/14 1830 04/17/14 1926 04/17/14 1930 04/17/14 2134  BP: 202/116 172/104 178/108 172/101  Pulse: 70 71 70 68  Temp:   98.4 F (36.9 C) 98.3 F (36.8 C)  TempSrc:    Oral  Resp: 14 16 16 16   Height:      Weight:      SpO2: 100% 98% 97% 99%   Meds given in ED:  Medications  sodium chloride 0.9 % bolus 1,000 mL (1,000 mLs  Intravenous New Bag/Given 04/17/14 1834)  metoCLOPramide (REGLAN) injection 10 mg (10 mg Intravenous Given 04/17/14 1834)  diphenhydrAMINE (BENADRYL) injection 25 mg (25 mg Intravenous Given 04/17/14 1834)  metroNIDAZOLE (FLAGYL) tablet 2,000 mg (2,000 mg Oral Given 04/17/14 2125)  lisinopril (PRINIVIL,ZESTRIL) tablet 10 mg (10 mg Oral Given 04/17/14 2125)    Discharge Medication List as of 04/17/2014  8:34 PM    START taking these medications   Details  cephALEXin (KEFLEX) 500 MG capsule Take 1 capsule (500 mg total) by mouth 4 (four) times daily., Starting 04/17/2014, Until Discontinued, Print    !! lisinopril (PRINIVIL,ZESTRIL) 10 MG tablet Take 1 tablet (10 mg total) by mouth daily., Starting 04/17/2014, Until Discontinued, Print     !! - Potential duplicate medications found. Please discuss with provider.         Harle Battiest, NP 04/19/14 1159  Tilden Fossa, MD 04/20/14 [phone]

## 2014-04-17 NOTE — Discharge Instructions (Signed)
Please follow the directions provided.  Be sure to establish care with a primary care provider to help manage your high blood pressure.  Take your lisinopril every day.  Don't hesitate to return for new, worsening or concerning symptoms.     SEEK IMMEDIATE MEDICAL CARE IF:  Your headache becomes severe.  You have a fever.  You have a stiff neck.  You have loss of vision.  You have muscular weakness or loss of muscle control.  You start losing your balance or have trouble walking.  You feel faint or pass out.  You have severe symptoms that are different from your first symptoms.   SEEK IMMEDIATE MEDICAL CARE IF:  You develop a severe headache or confusion.  You have unusual weakness, numbness, or feel faint.  You have severe chest or abdominal pain.  You vomit repeatedly.  You have trouble breathing.   Emergency Department Resource Guide 1) Find a Doctor and Pay Out of Pocket Although you won't have to find out who is covered by your insurance plan, it is a good idea to ask around and get recommendations. You will then need to call the office and see if the doctor you have chosen will accept you as a new patient and what types of options they offer for patients who are self-pay. Some doctors offer discounts or will set up payment plans for their patients who do not have insurance, but you will need to ask so you aren't surprised when you get to your appointment.  2) Contact Your Local Health Department Not all health departments have doctors that can see patients for sick visits, but many do, so it is worth a call to see if yours does. If you don't know where your local health department is, you can check in your phone book. The CDC also has a tool to help you locate your state's health department, and many state websites also have listings of all of their local health departments.  3) Find a Walk-in Clinic If your illness is not likely to be very severe or complicated, you may want to  try a walk in clinic. These are popping up all over the country in pharmacies, drugstores, and shopping centers. They're usually staffed by nurse practitioners or physician assistants that have been trained to treat common illnesses and complaints. They're usually fairly quick and inexpensive. However, if you have serious medical issues or chronic medical problems, these are probably not your best option.  No Primary Care Doctor: - Call Health Connect at  [phone] - they can help you locate a primary care doctor that  accepts your insurance, provides certain services, etc. - Physician Referral Service- [phone]  Chronic Pain Problems: Organization         Address  Phone   Notes  [facility]  [phone]) [phone] Patients need to be referred by their primary care doctor.   Medication Assistance: Organization         Address  Phone   Notes  [facility] [address] [place], [place] [address] [phone]) [phone] --Must be a resident of [facility] -- Must have NO insurance coverage whatsoever (no Medicaid/ Medicare, etc.) -- The pt. MUST have a primary care doctor that directs their care regularly and follows them in the community   MedAssist  [phone]) [phone]   [employer]  [phone]    Agencies that provide inexpensive medical care: Organization  Address  Phone   Notes  [facility]  [phone]) [phone]   [facility]    [phone]   [facility] [address] [place], [place] [address] [phone]) [phone]   [facility] [place] [address], [place] [phone]) [phone]   Planned Parenthood    [phone](336) [phone]   [facility]    [phone]) [phone]   [facility]  [address], [place] Phone:  [phone], Fax:  [phone](336) [phone] Hours of Operation:  9 am - 6 pm, M-F.  Also accepts Medicaid/Medicare and self-pay.  [facility]  [address], [place] Phone: [phone]) [phone], Fax: [phone]. Hours of Operation:  8:30 am - 5:30 pm, M-F.  Also accepts Medicaid and self-pay.  [facility] [place] [address], [place] Phone: [phone]) [phone]   [facility] [address], [place], [place] [phone], Ext. [phone] Mondays & Thursdays: 7-9 AM.  First 15 patients are seen on a first come, first serve basis.    Medicaid-accepting [facility] Providers:  Organization         Address  Phone   Notes  [facility] [address], [place] [phone] Also accepts self-pay patients.  [facility] [address], [place]  [phone]) [phone]   [facility] [address], [place] [phone]) [phone]   [facility] [address] [place] [address], [place] [phone](336) [phone]   Renaye RakersVeita Bland [address], [place]   [phone]) [phone] Only accepts [place] Access [place] patients after they have their name applied to their card.   Self-Pay (no insurance) in [facility]:  Organization         Address  Phone   Notes  Sickle Cell Patients, [facility] [address], [place] [phone]   [facility] [address], [place] [phone]) [phone]   [facility] [place]  [address], [place] [phone]) [phone]   [facility]/Dr. Osei-Bonsu  [address] [place] [address], [place] or [address], [place] [phone] Phone number for both [place] and [place] locations is the same.  [facility] [address], [place] [phone]) [phone]   [facility] [address] [place] [address], [place] or [address] [phone]) [phone]   [facility] [address], [place] [phone](336) [phone], phone; [phone]) [phone], fax  Sees patients 1st and 3rd Saturday of every month.  Must not qualify for public or private insurance (i.e. Medicaid, Medicare, [place] Health Choice, Veterans' Benefits)  Household income should be no more than 200% of the poverty level The clinic cannot treat you if you are pregnant or think you are pregnant  Sexually transmitted diseases are not treated at the clinic.    Dental Care: Organization         Address  Phone  Notes  [facility] [address], [place] [phone]) [phone] Accepts children up to age 27 who are enrolled in [place] or [place] Health Choice; pregnant women with a Medicaid card; and children who have applied for Medicaid or [place] Health Choice, but were declined, whose parents can pay a reduced fee at time  of service.  [facility]  [address], [place] [phone] Accepts children up to age 10 who are enrolled in [place] or [place] Health Choice; pregnant women with a Medicaid card; and children who have applied for Medicaid or [place] Health Choice, but were declined, whose parents can pay a reduced fee at time of service.  [facility]  [address] [place], [place] [phone] Patients are seen by appointment only. Walk-ins are not accepted. [facility] will see patients 30 years of age and older. Monday - Tuesday (8am-5pm) Most Wednesdays (8:30-5pm) $30 per visit, cash only  [facility] Access PRO[facility]  [address], [facility] [phone] Patients are seen by appointment only. Walk-ins are not accepted. [facility] will see patients 39 years of age and older. One Wednesday Evening (Monthly: Volunteer Based).  $30 per visit, cash only  [employer]  [phone] for adults; Children under age 9, call Graduate Pediatric Dentistry at [phone]. Children aged 39-14, please call [phone] to  request a pediatric application.  Dental services are provided in all areas of dental care including fillings, crowns and bridges, complete and partial dentures, implants, gum treatment, root canals, and extractions. Preventive care is also provided. Treatment is provided to both adults and children. Patients are selected via a lottery and there is often a waiting list.   [facility] [address] [place]  [phone] [email or web address]   [facility] [address] [place], [place] [phone], Ext. [phone] Second and Fourth Thursday of each month, opens at 6:30 AM; Clinic ends at 9 AM.  Patients are seen on a first-come first-served basis, and a limited number are seen during each clinic.   [facility]  [address] [place], [place] [phone]   Eligibility Requirements You must have lived in [place], [place], or [place] counties for at least the last three months.   You cannot be eligible for state or federal sponsored [place], including [employer], [place], or [employer].   You generally cannot be eligible for healthcare insurance through your employer.    How to apply: Eligibility screenings are held every Tuesday and Wednesday afternoon from 1:00 pm until 4:00 pm. You do not need an appointment for the interview!  [facility] [address], [place], [place] [phone]   [facility] Health Department  [phone]   [facility] Health Department  [phone]   [facility] Health Department  [phone]    Behavioral Health Resources in the Community: Intensive Outpatient Programs Organization         Address  Phone  Notes  [facility] [address], [place], [place] [phone]   [facility] Outpatient [address] [place] [address], [place], [place] [phone]   [facility] [address] [place], [place]  [phone]   [facility] [address]  [place], [place] [phone] or [phone]   Substance Abuse Resources Organization         Address  Phone  Notes  [facility] Drug Services  [phone]   [facility]  [phone]   [facility] [place]  [phone]   Floydene Flock  [phone]   Residential & Outpatient Substance Abuse Program  [phone]   Psychological Services Organization         Address  Phone  Notes  [employer] [facility]  [phone]   [facility]  [phone]   [facility] [address], [place] [phone] or [phone]    Mobile Crisis Teams Organization         Address  Phone  Notes  [facility], Mobile Crisis Care Unit  [phone]   [facility]  [address] [place], [place] [phone]   Doristine Locks [address] [place] [phone]    Self-Help/Support Groups Organization         Address  Phone             Notes  [facility] of [place] - variety of support groups  [phone]- I7437963 Call for more information  Narcotics Anonymous (NA), [facility] [address], [employer] [place]  2 meetings at this location   Statistician         Address  Phone  Notes  [facility] [address],    [place]  [phone]   [facility]  [address], [place] [address], [place], [place] [phone]   [facility] [address] [place], [place] [phone] Admissions: 8am-3pm M-F  [facility] [address],    [place], [place] [phone]   [facility] [address] [employer], [place], [place] [phone]   [facility] [address]  [place], [place] [phone]   [facility] - Intensive Outpatient [address], [place], [place] [phone]   [facility] ([facility] [address]  [place], [place] [phone] or [phone]   [facility] ([facility]) [address] [place], [place] [phone] Accepts Medicaid  [facility] [place] [address],  [place] [phone] Substance Abuse/Addiction Treatment   [facility] Organization         Address  Phone  Notes  [facility]  [phone]   Angie Fava, PhD [address] [place] [address] [place] [place], [place]   [phone] or [phone]   [facility]al   [address] [place], [place] [phone]   [facility] [address], [place], [place] [phone] Insurance/Medicaid/sponsorship through [facility] and Families [address]                                    [place], [place] [phone] Therapy/tele-psych/case  [facility] [address], [place] [phone]    Dr. Lolly Mustache  [phone]   [facility] [place]  [employer] [facility] Dept. 1) [address] [place] 2) [address], [place] 3)  [address], [place] [phone]  [phone]   [facility] Child Abuse Hotline 667 529 8096 or [phone] (After Hours)

## 2014-04-17 NOTE — ED Notes (Signed)
Patient transported to CT 

## 2014-04-17 NOTE — ED Notes (Signed)
Patient states she is having sharp chest pains that started last night.

## 2014-04-20 LAB — URINE CULTURE

## 2014-04-21 ENCOUNTER — Telehealth (HOSPITAL_BASED_OUTPATIENT_CLINIC_OR_DEPARTMENT_OTHER): Payer: Self-pay | Admitting: Emergency Medicine

## 2014-04-21 NOTE — Telephone Encounter (Signed)
Post ED Visit - Positive Culture Follow-up  Culture report reviewed by antimicrobial stewardship pharmacist: []  Wes Dulaney, Pharm.D., BCPS [x]  Celedonio MiyamotoJeremy Frens, Pharm.D., BCPS []  Georgina PillionElizabeth Martin, [address]Ph[address].D., BCPS []  SekiuMinh Pham, [address]Pharm.D., BCPS, AAHIVP []  Estella HuskMichelle Turner, Pharm.D., BCPS, AAHIVP []  Babs BertinHaley Baird, [address] BoulevardPharm.D.   Positive urine culture E. Coli Treated with cephalexin, organism sensitive to the same and no further patient follow-up is required at this time.  Berle MullMiller, Damilola Flamm 04/21/2014, 9:25 AM

## 2014-04-30 ENCOUNTER — Encounter (HOSPITAL_BASED_OUTPATIENT_CLINIC_OR_DEPARTMENT_OTHER): Payer: Self-pay

## 2014-04-30 ENCOUNTER — Emergency Department (HOSPITAL_BASED_OUTPATIENT_CLINIC_OR_DEPARTMENT_OTHER)
Admission: EM | Admit: 2014-04-30 | Discharge: 2014-04-30 | Disposition: A | Discharge status: Home / Self Care | Payer: Medicaid - Out of State | Attending: Emergency Medicine | Admitting: Emergency Medicine

## 2014-04-30 DIAGNOSIS — A5903 Trichomonal cystitis and urethritis: Secondary | ICD-10-CM | POA: Diagnosis not present

## 2014-04-30 DIAGNOSIS — N390 Urinary tract infection, site not specified: Secondary | ICD-10-CM

## 2014-04-30 DIAGNOSIS — Z720 Tobacco use: Secondary | ICD-10-CM | POA: Diagnosis not present

## 2014-04-30 DIAGNOSIS — Z3202 Encounter for pregnancy test, result negative: Secondary | ICD-10-CM | POA: Insufficient documentation

## 2014-04-30 DIAGNOSIS — I10 Essential (primary) hypertension: Secondary | ICD-10-CM | POA: Insufficient documentation

## 2014-04-30 DIAGNOSIS — A599 Trichomoniasis, unspecified: Secondary | ICD-10-CM

## 2014-04-30 DIAGNOSIS — Z79899 Other long term (current) drug therapy: Secondary | ICD-10-CM | POA: Insufficient documentation

## 2014-04-30 DIAGNOSIS — R300 Dysuria: Secondary | ICD-10-CM | POA: Diagnosis present

## 2014-04-30 HISTORY — DX: Essential (primary) hypertension: I10

## 2014-04-30 LAB — URINALYSIS, ROUTINE W REFLEX MICROSCOPIC
BILIRUBIN URINE: NEGATIVE
Glucose, UA: NEGATIVE mg/dL
Ketones, ur: NEGATIVE mg/dL
Nitrite: NEGATIVE
Protein, ur: 100 mg/dL — AB
Specific Gravity, Urine: 1.014 (ref 1.005–1.030)
UROBILINOGEN UA: 1 mg/dL (ref 0.0–1.0)
pH: 5.5 (ref 5.0–8.0)

## 2014-04-30 LAB — URINE MICROSCOPIC-ADD ON

## 2014-04-30 LAB — PREGNANCY, URINE: Preg Test, Ur: NEGATIVE

## 2014-04-30 MED ORDER — SULFAMETHOXAZOLE-TRIMETHOPRIM 800-160 MG PO TABS: 1.0000 | ORAL_TABLET | Freq: Two times a day (BID) | ORAL | Status: DC

## 2014-04-30 MED ORDER — METRONIDAZOLE 500 MG PO TABS
2000.0000 mg | ORAL_TABLET | Freq: Once | ORAL | Status: AC
  Administered 2014-04-30: 2000 mg via ORAL
  Filled 2014-04-30: qty 4

## 2014-04-30 MED ORDER — ONDANSETRON 4 MG PO TBDP
4.0000 mg | ORAL_TABLET | Freq: Once | ORAL | Status: AC
  Administered 2014-04-30: 4 mg via ORAL
  Filled 2014-04-30: qty 1

## 2014-04-30 NOTE — ED Notes (Signed)
C/o dysuria x 2 days.  

## 2014-04-30 NOTE — ED Provider Notes (Signed)
CSN: [id number]     Arrival date & time 04/30/14  1423 History   First MD Initiated Contact with Patient 04/30/14 1529     Chief Complaint  Patient presents with  . Dysuria     (Consider location/radiation/quality/duration/timing/severity/associated sxs/prior Treatment) Patient is a 28 y.o. female presenting with dysuria. The history is provided by the patient. No language interpreter was used.  Dysuria Pain quality:  Burning Pain severity:  Moderate Onset quality:  Gradual Duration:  2 days Timing:  Constant Progression:  Unchanged Chronicity:  New Recent urinary tract infections: no   Relieved by:  Nothing Worsened by:  Nothing tried Ineffective treatments:  None tried Urinary symptoms: no discolored urine, no foul-smelling urine, no frequent urination, no hematuria, no hesitancy and no bladder incontinence   Associated symptoms: no abdominal pain, no fever, no nausea and no vomiting   Risk factors: sexually active and sexually transmitted infections   Risk factors: no hx of pyelonephritis, no kidney transplant, no recurrent urinary tract infections, no renal disease and not single kidney     Past Medical History  Diagnosis Date  . Hypertension   . Malignant hypertension    Past Surgical History  Procedure Laterality Date  . No past surgeries     No family history on file. History  Substance Use Topics  . Smoking status: Current Every Day Smoker -- 0.50 packs/day for 10 years    Types: Cigarettes  . Smokeless tobacco: Never Used  . Alcohol Use: Yes     Comment: socially   OB History    No data available     Review of Systems  Constitutional: Negative for fever, chills and fatigue.  HENT: Negative for trouble swallowing.   Eyes: Negative for visual disturbance.  Respiratory: Negative for shortness of breath.   Cardiovascular: Negative for chest pain and palpitations.  Gastrointestinal: Negative for nausea, vomiting, abdominal pain and diarrhea.   Genitourinary: Positive for dysuria. Negative for difficulty urinating.  Musculoskeletal: Negative for arthralgias and neck pain.  Skin: Negative for color change.  Neurological: Negative for dizziness and weakness.  Psychiatric/Behavioral: Negative for dysphoric mood.      Allergies  Tramadol  Home Medications   Prior to Admission medications   Medication Sig Start Date End Date Taking? Authorizing Provider  lisinopril (PRINIVIL,ZESTRIL) 10 MG tablet Take 1 tablet (10 mg total) by mouth daily. 02/25/14   Costin Otelia SergeantM Gherghe, MD  lisinopril (PRINIVIL,ZESTRIL) 10 MG tablet Take 1 tablet (10 mg total) by mouth daily. 04/17/14   Harle BattiestElizabeth Tysinger, NP   BP 181/126 mmHg  Pulse 103  Temp(Src) 98.7 F (37.1 C) (Oral)  Resp 18  Ht 5\' 3"  (1.6 m)  Wt 150 lb (68.04 kg)  BMI 26.58 kg/m2  SpO2 100%  LMP 04/03/2014 Physical Exam  Constitutional: She is oriented to person, place, and time. She appears well-developed and well-nourished. No distress.  HENT:  Head: Normocephalic and atraumatic.  Eyes: Conjunctivae and EOM are normal.  Neck: Normal range of motion.  Cardiovascular: Normal rate and regular rhythm.  Exam reveals no gallop and no friction rub.   No murmur heard. Pulmonary/Chest: Effort normal and breath sounds normal. She has no wheezes. She has no rales. She exhibits no tenderness.  Abdominal: Soft. She exhibits no distension. There is no tenderness. There is no rebound.  Musculoskeletal: Normal range of motion.  Neurological: She is alert and oriented to person, place, and time. Coordination normal.  Speech is goal-oriented. Moves limbs without ataxia.   Skin:  Skin is warm and dry.  Psychiatric: She has a normal mood and affect. Her behavior is normal.  Nursing note and vitals reviewed.   ED Course  Procedures (including critical care time) Labs Review Labs Reviewed  URINALYSIS, ROUTINE W REFLEX MICROSCOPIC - Abnormal; Notable for the following:    Color, Urine AMBER  (*)    APPearance TURBID (*)    Hgb urine dipstick LARGE (*)    Protein, ur 100 (*)    Leukocytes, UA LARGE (*)    All other components within normal limits  URINE MICROSCOPIC-ADD ON - Abnormal; Notable for the following:    Squamous Epithelial / LPF MANY (*)    Bacteria, UA MANY (*)    All other components within normal limits  PREGNANCY, URINE    Imaging Review No results found.   EKG Interpretation None      MDM   Final diagnoses:  UTI (lower urinary tract infection)  Trichomonas infection    4:16 PM Urinalysis shows UTI and trichomonas. Patient will be treated with 2g PO flagyl. Vitals stable and patient afebrile. Patient will be discharged with Bactrim for UTI.     Emilia BeckKaitlyn Khloey Chern, PA-C 04/30/14 1707  Glynn OctaveStephen Rancour, MD 04/30/14 [id number]

## 2014-04-30 NOTE — Discharge Instructions (Signed)
You have been treated for trichomonas here. Take bactrim as directed until gone for UTI. Refer to attached documents for more information.

## 2014-04-30 NOTE — ED Notes (Signed)
pa at bedside. 

## 2014-06-04 ENCOUNTER — Emergency Department (HOSPITAL_BASED_OUTPATIENT_CLINIC_OR_DEPARTMENT_OTHER)
Admission: EM | Admit: 2014-06-04 | Discharge: 2014-06-04 | Disposition: A | Discharge status: Home / Self Care | Payer: Medicaid - Out of State | Attending: Emergency Medicine | Admitting: Emergency Medicine

## 2014-06-04 ENCOUNTER — Encounter (HOSPITAL_BASED_OUTPATIENT_CLINIC_OR_DEPARTMENT_OTHER): Payer: Self-pay

## 2014-06-04 DIAGNOSIS — Z79899 Other long term (current) drug therapy: Secondary | ICD-10-CM | POA: Diagnosis not present

## 2014-06-04 DIAGNOSIS — Z8739 Personal history of other diseases of the musculoskeletal system and connective tissue: Secondary | ICD-10-CM | POA: Diagnosis not present

## 2014-06-04 DIAGNOSIS — Z8673 Personal history of transient ischemic attack (TIA), and cerebral infarction without residual deficits: Secondary | ICD-10-CM | POA: Diagnosis not present

## 2014-06-04 DIAGNOSIS — Z720 Tobacco use: Secondary | ICD-10-CM | POA: Diagnosis not present

## 2014-06-04 DIAGNOSIS — I10 Essential (primary) hypertension: Secondary | ICD-10-CM | POA: Diagnosis present

## 2014-06-04 DIAGNOSIS — N390 Urinary tract infection, site not specified: Secondary | ICD-10-CM | POA: Diagnosis not present

## 2014-06-04 DIAGNOSIS — N898 Other specified noninflammatory disorders of vagina: Secondary | ICD-10-CM | POA: Diagnosis not present

## 2014-06-04 DIAGNOSIS — Z3202 Encounter for pregnancy test, result negative: Secondary | ICD-10-CM | POA: Insufficient documentation

## 2014-06-04 DIAGNOSIS — Z792 Long term (current) use of antibiotics: Secondary | ICD-10-CM | POA: Insufficient documentation

## 2014-06-04 HISTORY — DX: Fibromyalgia: M79.7

## 2014-06-04 HISTORY — DX: Personal history of transient ischemic attack (TIA), and cerebral infarction without residual deficits: Z86.73

## 2014-06-04 LAB — URINALYSIS, ROUTINE W REFLEX MICROSCOPIC
Bilirubin Urine: NEGATIVE
GLUCOSE, UA: NEGATIVE mg/dL
KETONES UR: NEGATIVE mg/dL
Leukocytes, UA: NEGATIVE
Nitrite: POSITIVE — AB
PH: 6 (ref 5.0–8.0)
PROTEIN: NEGATIVE mg/dL
Specific Gravity, Urine: 1.02 (ref 1.005–1.030)
Urobilinogen, UA: 1 mg/dL (ref 0.0–1.0)

## 2014-06-04 LAB — WET PREP, GENITAL
Clue Cells Wet Prep HPF POC: NONE SEEN
TRICH WET PREP: NONE SEEN
Yeast Wet Prep HPF POC: NONE SEEN

## 2014-06-04 LAB — URINE MICROSCOPIC-ADD ON

## 2014-06-04 LAB — PREGNANCY, URINE: PREG TEST UR: NEGATIVE

## 2014-06-04 MED ORDER — IBUPROFEN 800 MG PO TABS
ORAL_TABLET | ORAL | Status: AC
  Filled 2014-06-04: qty 1

## 2014-06-04 MED ORDER — METOPROLOL TARTRATE 50 MG PO TABS
ORAL_TABLET | ORAL | Status: AC
  Administered 2014-06-04: 50 mg via ORAL
  Filled 2014-06-04: qty 1

## 2014-06-04 MED ORDER — METOPROLOL TARTRATE 50 MG PO TABS
50.0000 mg | ORAL_TABLET | Freq: Once | ORAL | Status: AC
  Administered 2014-06-04: 50 mg via ORAL

## 2014-06-04 MED ORDER — LABETALOL HCL 100 MG PO TABS: 100.0000 mg | ORAL_TABLET | Freq: Three times a day (TID) | ORAL | Status: AC

## 2014-06-04 MED ORDER — HYDROCHLOROTHIAZIDE 25 MG PO TABS
25.0000 mg | ORAL_TABLET | Freq: Every day | ORAL | Status: AC
Start: 2014-06-04 — End: ?

## 2014-06-04 MED ORDER — IBUPROFEN 800 MG PO TABS: 800.0000 mg | ORAL_TABLET | Freq: Once | ORAL | Status: DC

## 2014-06-04 MED ORDER — AMLODIPINE BESYLATE 10 MG PO TABS: 10.0000 mg | ORAL_TABLET | Freq: Every day | ORAL | Status: AC

## 2014-06-04 MED ORDER — FUROSEMIDE 40 MG PO TABS: 40.0000 mg | ORAL_TABLET | Freq: Every day | ORAL | Status: AC | PRN

## 2014-06-04 MED ORDER — SULFAMETHOXAZOLE-TRIMETHOPRIM 800-160 MG PO TABS: 1.0000 | ORAL_TABLET | Freq: Two times a day (BID) | ORAL | Status: AC

## 2014-06-04 NOTE — ED Notes (Signed)
Pt reports headache, chest pain, states she knows her blood pressure is elevated, reports being out of antihypertensives, also reports that she has vaginal discharge x1 week and wants to have a STD check.

## 2014-06-04 NOTE — Discharge Instructions (Signed)
Antihypertensive medications as prescribed.  Bactrim as prescribed.  We will call you if your cultures indicate that you require further treatment.  Follow-up with a primary Dr. If you intend to remain in the area for an extended period of time. The resource guide has been provided in this discharge summary which may assist you.   Urinary Tract Infection Urinary tract infections (UTIs) can develop anywhere along your urinary tract. Your urinary tract is your body's drainage system for removing wastes and extra water. Your urinary tract includes two kidneys, two ureters, a bladder, and a urethra. Your kidneys are a pair of bean-shaped organs. Each kidney is about the size of your fist. They are located below your ribs, one on each side of your spine. CAUSES Infections are caused by microbes, which are microscopic organisms, including fungi, viruses, and bacteria. These organisms are so small that they can only be seen through a microscope. Bacteria are the microbes that most commonly cause UTIs. SYMPTOMS  Symptoms of UTIs may vary by age and gender of the patient and by the location of the infection. Symptoms in young women typically include a frequent and intense urge to urinate and a painful, burning feeling in the bladder or urethra during urination. Older women and men are more likely to be tired, shaky, and weak and have muscle aches and abdominal pain. A fever may mean the infection is in your kidneys. Other symptoms of a kidney infection include pain in your back or sides below the ribs, nausea, and vomiting. DIAGNOSIS To diagnose a UTI, your caregiver will ask you about your symptoms. Your caregiver also will ask to provide a urine sample. The urine sample will be tested for bacteria and white blood cells. White blood cells are made by your body to help fight infection. TREATMENT  Typically, UTIs can be treated with medication. Because most UTIs are caused by a bacterial infection, they  usually can be treated with the use of antibiotics. The choice of antibiotic and length of treatment depend on your symptoms and the type of bacteria causing your infection. HOME CARE INSTRUCTIONS  If you were prescribed antibiotics, take them exactly as your caregiver instructs you. Finish the medication even if you feel better after you have only taken some of the medication.  Drink enough water and fluids to keep your urine clear or pale yellow.  Avoid caffeine, tea, and carbonated beverages. They tend to irritate your bladder.  Empty your bladder often. Avoid holding urine for long periods of time.  Empty your bladder before and after sexual intercourse.  After a bowel movement, women should cleanse from front to back. Use each tissue only once. SEEK MEDICAL CARE IF:   You have back pain.  You develop a fever.  Your symptoms do not begin to resolve within 3 days. SEEK IMMEDIATE MEDICAL CARE IF:   You have severe back pain or lower abdominal pain.  You develop chills.  You have nausea or vomiting.  You have continued burning or discomfort with urination. MAKE SURE YOU:   Understand these instructions.  Will watch your condition.  Will get help right away if you are not doing well or get worse. Document Released: 02/14/2005 Document Revised: 11/06/2011 Document Reviewed: 06/15/2011 [facility] Patient Information 2015 [place], [place]. This information is not intended to replace advice given to you by your health care provider. Make sure you discuss any questions you have with your health care provider.  Hypertension Hypertension, commonly called high blood pressure, is  when the force of blood pumping through your arteries is too strong. Your arteries are the blood vessels that carry blood from your heart throughout your body. A blood pressure reading consists of a higher number over a lower number, such as 110/72. The higher number (systolic) is the pressure inside your  arteries when your heart pumps. The lower number (diastolic) is the pressure inside your arteries when your heart relaxes. Ideally you want your blood pressure below 120/80. Hypertension forces your heart to work harder to pump blood. Your arteries may become narrow or stiff. Having hypertension puts you at risk for heart disease, stroke, and other problems.  RISK FACTORS Some risk factors for high blood pressure are controllable. Others are not.  Risk factors you cannot control include:   Race. You may be at higher risk if you are African American.  Age. Risk increases with age.  Gender. Men are at higher risk than women before age 77 years. After age 40, women are at higher risk than men. Risk factors you can control include:  Not getting enough exercise or physical activity.  Being overweight.  Getting too much fat, sugar, calories, or salt in your diet.  Drinking too much alcohol. SIGNS AND SYMPTOMS Hypertension does not usually cause signs or symptoms. Extremely high blood pressure (hypertensive crisis) may cause headache, anxiety, shortness of breath, and nosebleed. DIAGNOSIS  To check if you have hypertension, your health care provider will measure your blood pressure while you are seated, with your arm held at the level of your heart. It should be measured at least twice using the same arm. Certain conditions can cause a difference in blood pressure between your right and left arms. A blood pressure reading that is higher than normal on one occasion does not mean that you need treatment. If one blood pressure reading is high, ask your health care provider about having it checked again. TREATMENT  Treating high blood pressure includes making lifestyle changes and possibly taking medicine. Living a healthy lifestyle can help lower high blood pressure. You may need to change some of your habits. Lifestyle changes may include:  Following the DASH diet. This diet is high in fruits,  vegetables, and whole grains. It is low in salt, red meat, and added sugars.  Getting at least 2 hours of brisk physical activity every week.  Losing weight if necessary.  Not smoking.  Limiting alcoholic beverages.  Learning ways to reduce stress. If lifestyle changes are not enough to get your blood pressure under control, your health care provider may prescribe medicine. You may need to take more than one. Work closely with your health care provider to understand the risks and benefits. HOME CARE INSTRUCTIONS  Have your blood pressure rechecked as directed by your health care provider.   Take medicines only as directed by your health care provider. Follow the directions carefully. Blood pressure medicines must be taken as prescribed. The medicine does not work as well when you skip doses. Skipping doses also puts you at risk for problems.   Do not smoke.   Monitor your blood pressure at home as directed by your health care provider. SEEK MEDICAL CARE IF:   You think you are having a reaction to medicines taken.  You have recurrent headaches or feel dizzy.  You have swelling in your ankles.  You have trouble with your vision. SEEK IMMEDIATE MEDICAL CARE IF:  You develop a severe headache or confusion.  You have unusual weakness, numbness, or  feel faint.  You have severe chest or abdominal pain.  You vomit repeatedly.  You have trouble breathing. MAKE SURE YOU:   Understand these instructions.  Will watch your condition.  Will get help right away if you are not doing well or get worse. Document Released: 05/07/2005 Document Revised: 09/21/2013 Document Reviewed: 02/27/2013 [facility] Patient Information 2015 [place], [place]. This information is not intended to replace advice given to you by your health care provider. Make sure you discuss any questions you have with your health care provider.    Emergency Department Resource Guide 1) Find a Doctor and Pay  Out of Pocket Although you won't have to find out who is covered by your insurance plan, it is a good idea to ask around and get recommendations. You will then need to call the office and see if the doctor you have chosen will accept you as a new patient and what types of options they offer for patients who are self-pay. Some doctors offer discounts or will set up payment plans for their patients who do not have insurance, but you will need to ask so you aren't surprised when you get to your appointment.  2) Contact Your Local Health Department Not all health departments have doctors that can see patients for sick visits, but many do, so it is worth a call to see if yours does. If you don't know where your local health department is, you can check in your phone book. The CDC also has a tool to help you locate your state's health department, and many state websites also have listings of all of their local health departments.  3) Find a Walk-in Clinic If your illness is not likely to be very severe or complicated, you may want to try a walk in clinic. These are popping up all over the country in pharmacies, drugstores, and shopping centers. They're usually staffed by nurse practitioners or physician assistants that have been trained to treat common illnesses and complaints. They're usually fairly quick and inexpensive. However, if you have serious medical issues or chronic medical problems, these are probably not your best option.  No Primary Care Doctor: - Call Health Connect at  [phone] - they can help you locate a primary care doctor that  accepts your insurance, provides certain services, etc. - Physician Referral Service- [phone]  Chronic Pain Problems: Organization         Address  Phone   Notes  [facility]  [phone] Patients need to be referred by their primary care doctor.   Medication Assistance: Organization         Address  Phone   Notes  [facility]  Medication Doctors [facility] [address] [place], [place] [address] [phone] --Must be a resident of [facility] [place] -- Must have NO insurance coverage whatsoever (no Medicaid/ Medicare, etc.) -- The pt. MUST have a primary care doctor that directs their care regularly and follows them in the community   MedAssist  [phone]   [employer]  [phone]    Agencies that provide inexpensive medical care: Organization         Address  Phone   Notes  [facility]  [phone]   [facility]    [phone]   [facility] [address] [place], [place] [address] [phone]   [facility] [address], [place] [phone]  Planned Parenthood    [phone]   [facility]    [phone]   Community Health and [facility]  [address], [place] Phone:  [phone], Fax:  [phone] Hours of Operation:  9 am - 6 pm, M-F.  Also accepts Medicaid/Medicare and self-pay.  [facility]  [address], [place] Phone: [phone], Fax: [phone]. Hours of Operation:  8:30 am - 5:30 pm, M-F.  Also accepts Medicaid and self-pay.  [facility] [place] [address], [place] Phone: [phone]   [facility] [address] Natasha Bence [place], [place] [phone], Ext. 123 Mondays & Thursdays: 7-9 AM.  First 15 patients are seen on a first come, first serve basis.    Medicaid-accepting [facility] Providers:  Organization         Address  Phone   Notes  [facility] [address], [place] [phone] Also accepts self-pay patients.  [facility] [address] Laurell Josephs [place], [place]  [phone]   [facility] [address], [place] [phone]   [facility] [address] [place] [phone]   Renaye Rakers [address], [place]   [phone] Only accepts [place] Access [place] patients after they have their name applied to their card.   Self-Pay (no insurance) in [facility]:  Organization         Address  Phone   Notes  Sickle Cell Patients, [facility] [address] [place], [place] [phone]   [facility] [address] [place], [place] [phone]   [facility]  [address],  [phone]   [facility]/Dr. Osei-Bonsu  [address], [place] or [address], [place] [phone] Phone number for both [place] and [place] locations is the same.  Urgent Medical and [facility] [address], [place] [phone]   [facility] [address], [place] or [address] [phone]   [facility] [address] [place] [phone], phone; [phone], fax Sees patients 1st and 3rd Saturday of every month.  Must not qualify for public or private insurance (i.e. Medicaid, Medicare, [place] Health Choice, Veterans' Benefits)  Household income should be no more than 200% of the poverty level The clinic cannot treat you if you are pregnant or think you are pregnant  Sexually transmitted diseases are not treated at the clinic.    Dental Care: Organization         Address  Phone  Notes  [facility] of [facility] [address] [place], [place] [phone] Accepts children up to age 54 who are enrolled in [place] or [place] Health Choice; pregnant women with a Medicaid card; and children who have applied for Medicaid or [place] Health Choice, but were declined, whose parents can pay a reduced fee at time of service.  [facility]  [address], [place]  [phone] Accepts children up to age 17 who are enrolled in [place] or [place] Health Choice; pregnant women with a Medicaid card; and children who have applied for Medicaid or [place] Health Choice, but were declined,  whose parents can pay a reduced fee at time of service.  [facility]  [address] [place], [place] [phone] Patients are seen by appointment only. Walk-ins are not accepted. [facility] will see patients 15 years of age and older. Monday - Tuesday (8am-5pm) Most Wednesdays (8:30-5pm) $30 per visit, cash only  [facility] Adult Dental Access PROGRAM  [address], [facility] [phone] Patients are seen by appointment only. Walk-ins are not accepted. [facility] will see patients 78 years of age and older. One Wednesday Evening (Monthly: Volunteer Based).  $30 per visit, cash only  [employer]  [phone] for adults; Children under age 34, call Graduate Pediatric Dentistry at [phone]. Children aged 58-14, please call [phone] to request a pediatric application.  Dental services are provided in all areas of dental care including fillings, crowns and bridges, complete and partial dentures, implants, gum treatment, root canals, and extractions. Preventive care is also provided. Treatment is provided to both adults and children. Patients are selected via a lottery and there is often a waiting list.   [facility] [address], [place]  [phone] [email or web address]   [facility] [address] [place], [place] [phone], Ext. [phone] Second and Fourth Thursday of each month, opens at 6:30 AM; Clinic ends at 9 AM.  Patients are seen on a first-come first-served basis, and a limited number are seen during each clinic.   [facility]  [address] Ether Griffins [place], [place] [phone]   Eligibility Requirements You must have lived in [place], [place], or [place]  counties for at least the last three months.   You cannot be eligible for state or federal sponsored [place], including [employer], [place], or [employer].   You generally cannot be eligible for healthcare insurance through your employer.    How to apply: Eligibility screenings are held every Tuesday and Wednesday afternoon from 1:00 pm until 4:00 pm. You do not need an appointment for the interview!  [facility] [address] [place], [place] [phone]   [facility] Health Department  [phone]   [facility] Health Department  [phone]   [facility] Health Department  [phone]    Behavioral Health Resources in the Community: Intensive Outpatient Programs Organization         Address  Phone  Notes  [facility] [address], [place], [place] [phone]   [facility] [address] [place], [place] [phone]   [facility]: [facility] Drug S[facility] [address] [place], [place]  [phone]   [facility] Mental Health [address],  [place], [place] [phone] or [phone]   Substance Abuse Resources Organization         Address  Phone  Notes  [facility] Drug Services  [phone]   [facility]  [phone]   [facility]  [phone]   Floydene Flock  [phone]   Residential & Outpatient Substance Abuse Program  [phone]   Psychological Services Organization         Address  Phone  Notes  [facility]  [phone]   [facility]  [phone]   [facility] Mental Health [address], [place] [phone] or [phone]    Mobile Crisis Teams Organization         Address  Phone  Notes  [facility], Mobile Crisis  Care Unit  [phone]   [facility]  [address] [place], [place] [phone]   [facility] [address] [place]  [place] [phone]    Self-Help/Support Groups Organization         Address  Phone             Notes  [facility] [place] - variety of support groups  [phone]- I7437963 Call for more information  Narcotics Anonymous (NA), [facility] [address], [employer] [place]  2 meetings at this location   Statistician         Address  Phone  Notes  [facility] [address],    [place]  [phone]   [facility]  [address] [place] [address], [place], [place] [phone]   [facility] [address] [place], [place] [phone] Admissions: 8am-3pm M-F  [facility] [address],    [place], [place] [phone]   [facility] [address] [place], [place], [place] [phone]   [facility] [address],  [place], [place] [phone]   [facility] - Intensive Outpatient [address] Laurell Josephs [address], [place], [place] [phone]   [facility] (Addiction Recovery [facility] [address]  [place], [place] [phone] or [phone]   [facility] ([facility]) [address] [place], [place] [phone] Accepts Medicaid  [facility] [address],  [place] [phone] Substance Abuse/Addiction Treatment   [facility] Organization         Address  Phone  Notes  [facility]  [phone]   Angie Fava, PhD [address] Ervin Knack [place], [place]   [phone] or [phone]   [facility] Behavioral   [address] [place], [place] [phone]   [facility] [address] [place], [place] [phone] Insurance/Medicaid/sponsorship through [facility] and Families [address]                                    [place], [place] [phone] Therapy/tele-psych/case  [facility] [address], [place] [phone]    Dr. Lolly Mustache  [phone]   [facility]  [employer] [facility] Dept. 1) [address], [place] 2) [address] [place] 3)  [address], [place] [phone]  [phone]   [facility] Child Abuse Hotline [phone] or [phone] (After Hours)

## 2014-06-04 NOTE — ED Provider Notes (Signed)
CSN: [id number]     Arrival date & time 06/04/14  0913 History   First MD Initiated Contact with Patient 06/04/14 [phone]     Chief Complaint  Patient presents with  . Hypertension     (Consider location/radiation/quality/duration/timing/severity/associated sxs/prior Treatment) HPI Comments: Patient is a 29 year old female with history of hypertension and fibromyalgia. She presents today with complaints of elevated blood pressure and vaginal discharge. She was recently treated for trichomonas and has since had unprotected sex with a new partner. She noticed a vaginal discharge over the past week and is concerned she may have an STD.  She moved here several months ago from [place] and states that she has run out of her antihypertensive she was prescribed there. She has been started on medications here which have been ineffective. She tells me the physicians in this area have been reluctant to start her on the medications that she normally takes and is frustrated by this. She denies to me she is experiencing any chest pains, difficulty breathing, headaches. She was seen approximately 2 months ago for elevated blood pressure and had extensive workup.  Patient is a 29 y.o. female presenting with hypertension. The history is provided by the patient.  Hypertension This is a chronic problem. The problem occurs constantly. The problem has not changed since onset.Pertinent negatives include no chest pain, no headaches and no shortness of breath. Nothing aggravates the symptoms. Nothing relieves the symptoms. She has tried nothing for the symptoms. The treatment provided no relief.    Past Medical History  Diagnosis Date  . Hypertension   . Malignant hypertension   . History of TIAs   . Fibromyalgia    Past Surgical History  Procedure Laterality Date  . No past surgeries     History reviewed. No pertinent family history. History  Substance Use Topics  . Smoking status: Current Every Day Smoker  -- 0.50 packs/day for 10 years    Types: Cigarettes  . Smokeless tobacco: Never Used  . Alcohol Use: Yes     Comment: socially   OB History    No data available     Review of Systems  Respiratory: Negative for shortness of breath.   Cardiovascular: Negative for chest pain.  Neurological: Negative for headaches.  All other systems reviewed and are negative.     Allergies  Tramadol  Home Medications   Prior to Admission medications   Medication Sig Start Date End Date Taking? Authorizing Provider  lisinopril (PRINIVIL,ZESTRIL) 10 MG tablet Take 1 tablet (10 mg total) by mouth daily. 02/25/14  Yes Costin Otelia Sergeant, MD  lisinopril (PRINIVIL,ZESTRIL) 10 MG tablet Take 1 tablet (10 mg total) by mouth daily. 04/17/14   Harle Battiest, NP  sulfamethoxazole-trimethoprim (SEPTRA DS) 800-160 MG per tablet Take 1 tablet by mouth every 12 (twelve) hours. 04/30/14   Kaitlyn Szekalski, PA-C   BP 181/141 mmHg  Pulse 101  Temp(Src) 99 F (37.2 C) (Oral)  Resp 18  Ht  (1.6 m)  Wt 130 lb (58.968 kg)  BMI 23.03 kg/m2  SpO2 100%  LMP 05/27/2014 Physical Exam  Constitutional: She is oriented to person, place, and time. She appears well-developed and well-nourished. No distress.  HENT:  Head: Normocephalic and atraumatic.  Neck: Normal range of motion. Neck supple.  Cardiovascular: Normal rate and regular rhythm.  Exam reveals no gallop and no friction rub.   No murmur heard. Pulmonary/Chest: Effort normal and breath sounds normal. No respiratory distress. She has no wheezes.  Abdominal: Soft. Bowel sounds are normal. She exhibits no distension. There is no tenderness.  Genitourinary: Uterus normal. Vaginal discharge found.  There is a mucousy discharge noted coming from the cervix. There is no cervical motion tenderness. There is no adnexal tenderness or masses.  Musculoskeletal: Normal range of motion.  Neurological: She is alert and oriented to person, place, and time.  Skin:  Skin is warm and dry. She is not diaphoretic.  Nursing note and vitals reviewed.   ED Course  Procedures (including critical care time) Labs Review Labs Reviewed - No data to display  Imaging Review No results found.   EKG Interpretation   Date/Time:  Friday June 04 2014 09:32:14 EST Ventricular Rate:  91 PR Interval:  176 QRS Duration: 86 QT Interval:  358 QTC Calculation: 440 R Axis:   75 Text Interpretation:  Normal sinus rhythm Normal ECG Confirmed by DELOS   MD, Carrell Palmatier ([id number]) on 06/04/2014 10:15:48 AM      MDM   Final diagnoses:  None    Workup reveals few WBCs on the wet prep and evidence for a urinary tract infection. She will be treated with Bactrim and discharged to home, pending cervix cultures.  Her blood pressure has improved somewhat with metoprolol and Motrin. I have agreed to prescribe the medication she was given by her provider in [place] for better control of her blood pressure. I've advised her to obtain a local doctor with whom she can follow-up. I see no indication for further workup at this time.    Geoffery Lyonsouglas Darshawn Boateng, MD 06/04/14 1040

## 2015-10-09 IMAGING — CR DG CHEST 2V
2 series · 2 of 2 positions shown · non-contrast
Comparison: None.

CLINICAL DATA: Chest pain starting last night

EXAM:
CHEST  2 VIEW

[w chest pa]
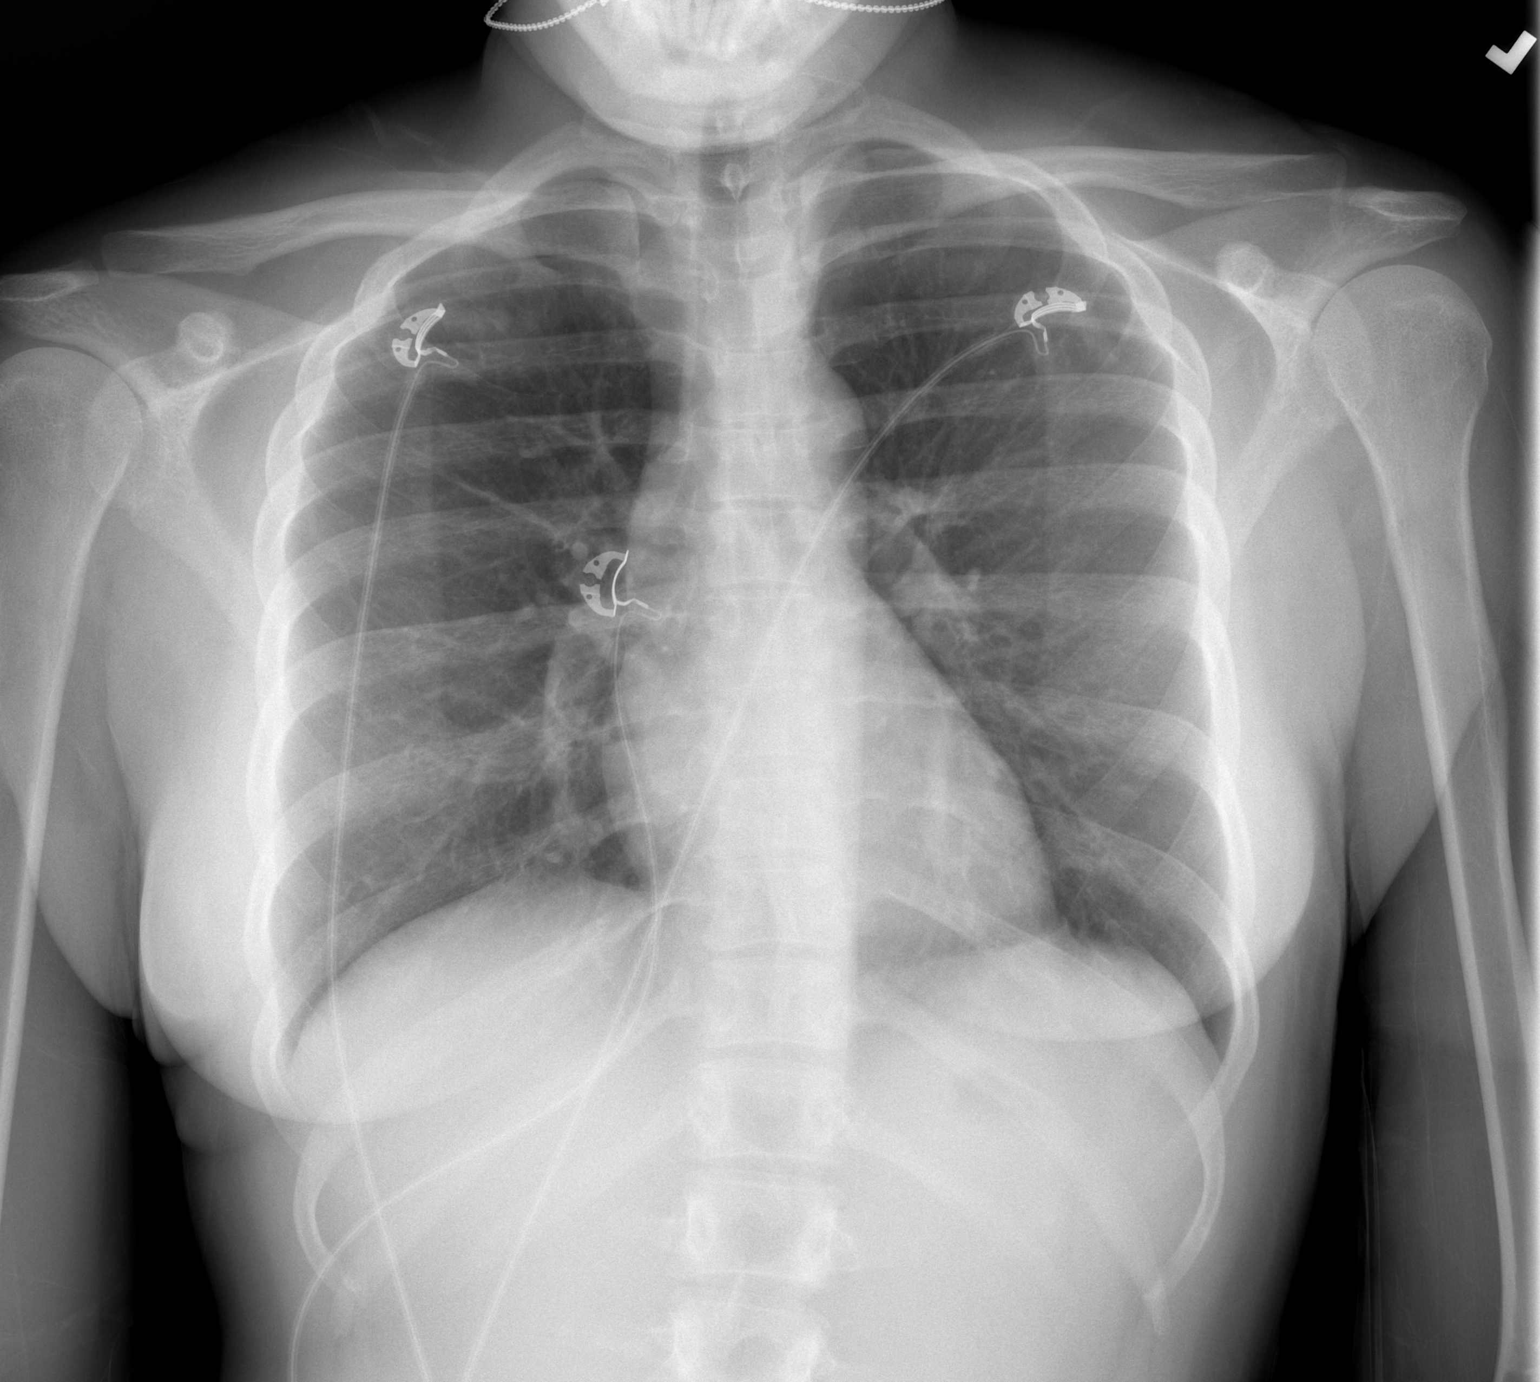

[w chest lat]
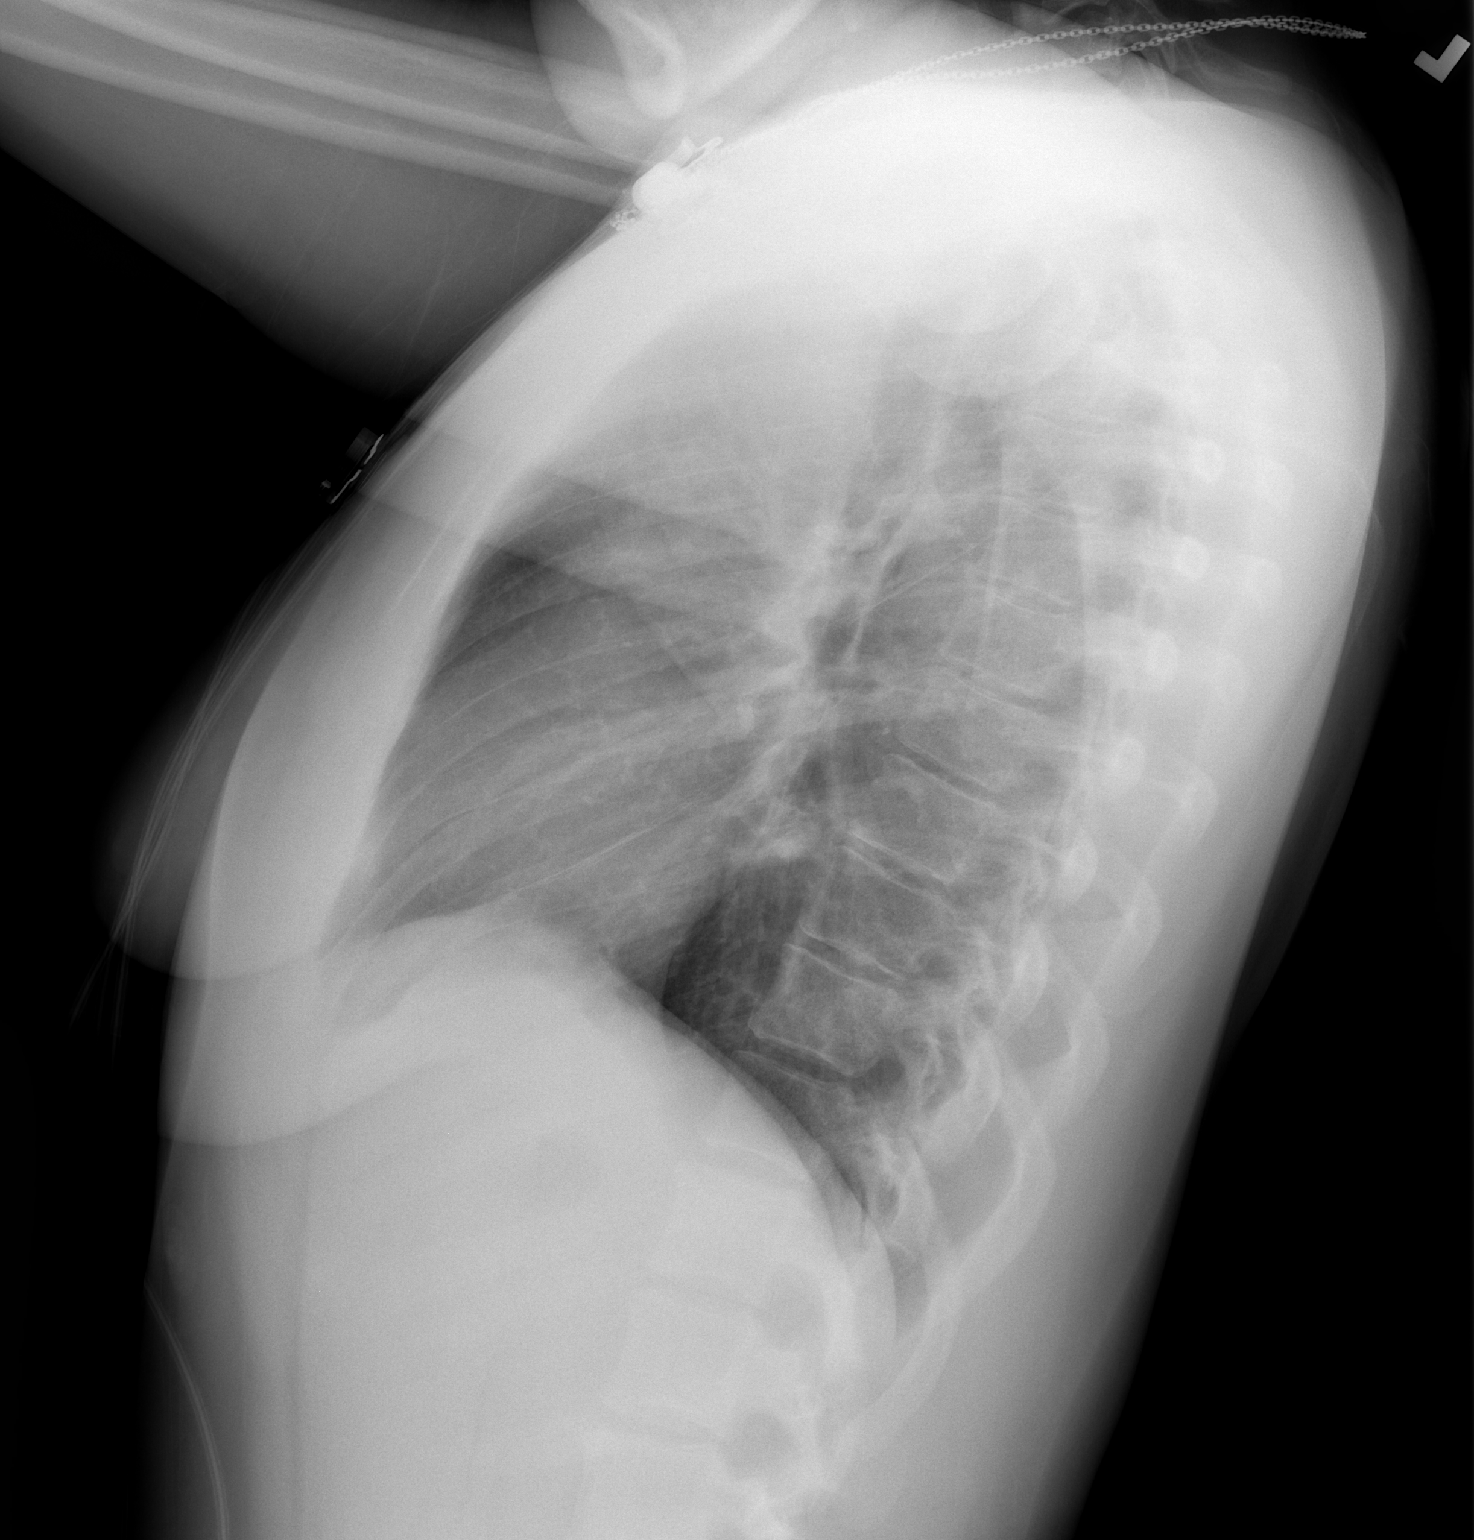

[2 of 2 positions shown; findings below may reference images not displayed]

FINDINGS: The heart size and mediastinal contours are within normal limits.
Both lungs are clear. The visualized skeletal structures are
unremarkable.
IMPRESSION: No active cardiopulmonary disease.

## 2015-10-09 IMAGING — CT CT HEAD W/O CM
1 series · 16 of 30 positions shown, 20 images · non-contrast
Comparison: None.

CLINICAL DATA: Headache.  Uncontrolled hypertension.

EXAM:
CT HEAD WITHOUT CONTRAST
TECHNIQUE: Contiguous axial images were obtained from the base of the skull
through the vertex without intravenous contrast.

[Series 2: head 4.8 h37s · axial · 0.45mm/px · z∈[-199,-66]mm · 16 of 32 slices shown, 20 images]
[im 2/32  brain]
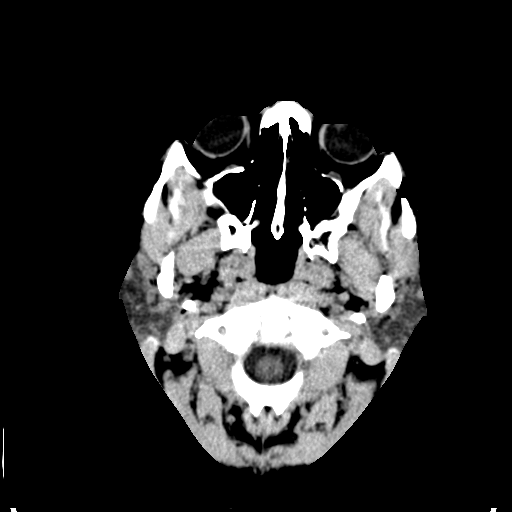
[im 2/32  bone]
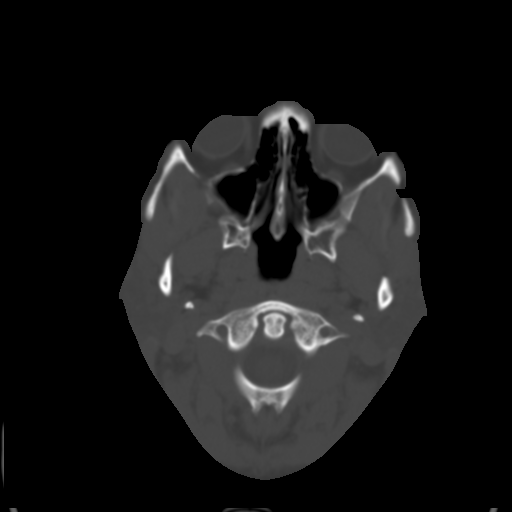
[im 4/32  brain]
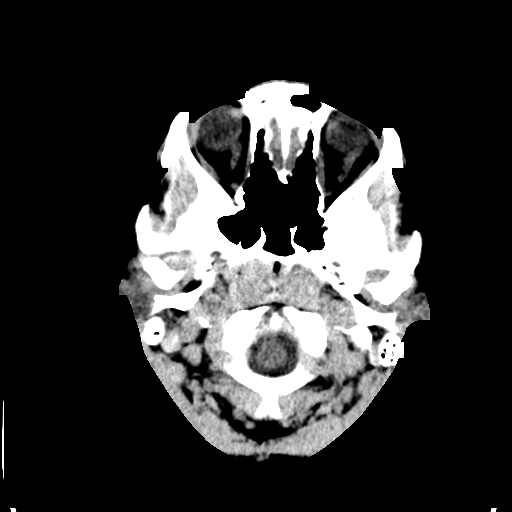
[im 6/32  brain]
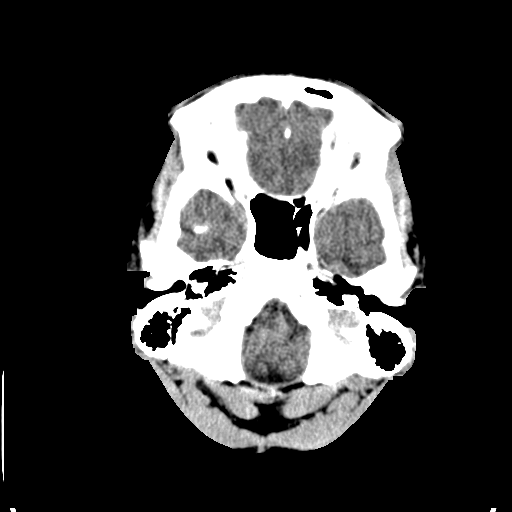
[im 8/32  brain]
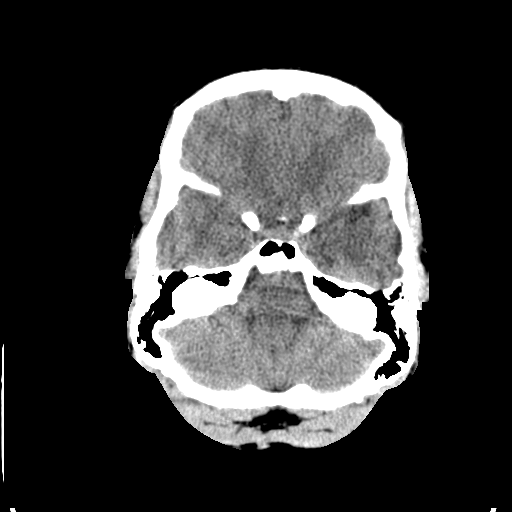
[im 9/32  brain]
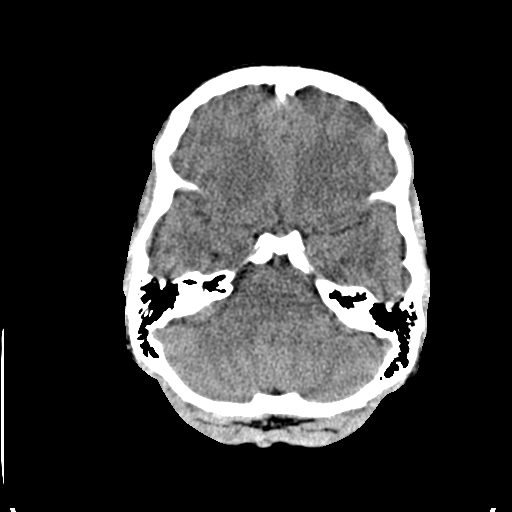
[im 9/32  bone]
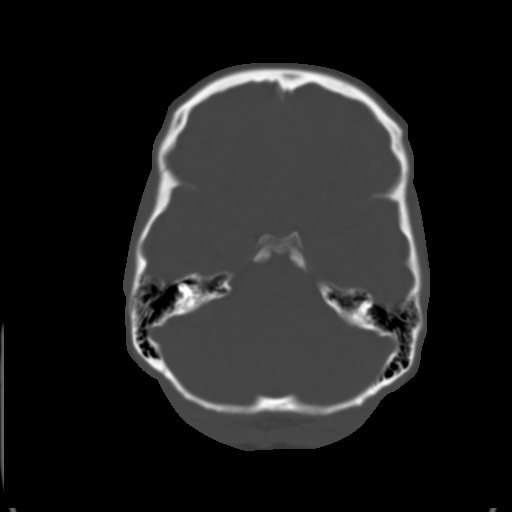
[im 11/32  brain]
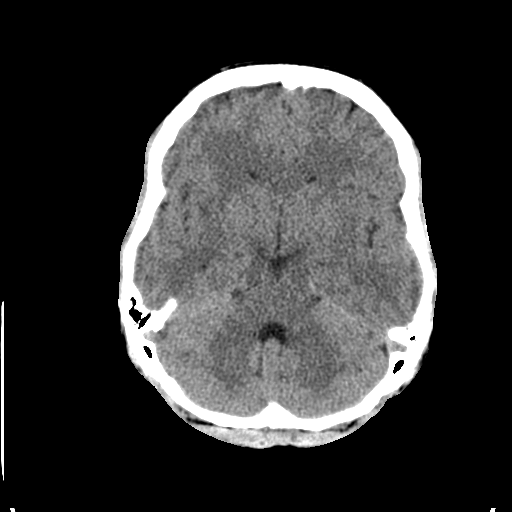
[im 13/32  brain]
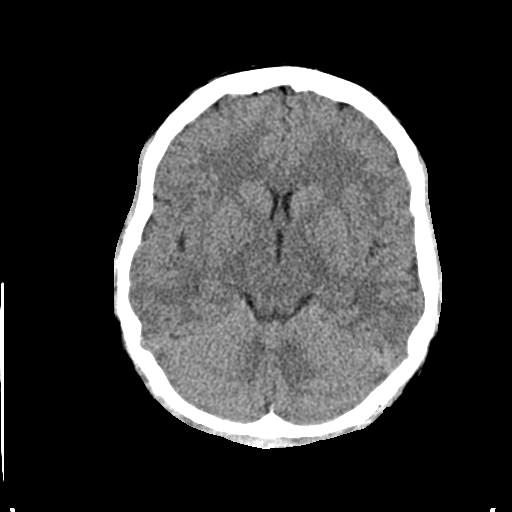
[im 15/32  brain]
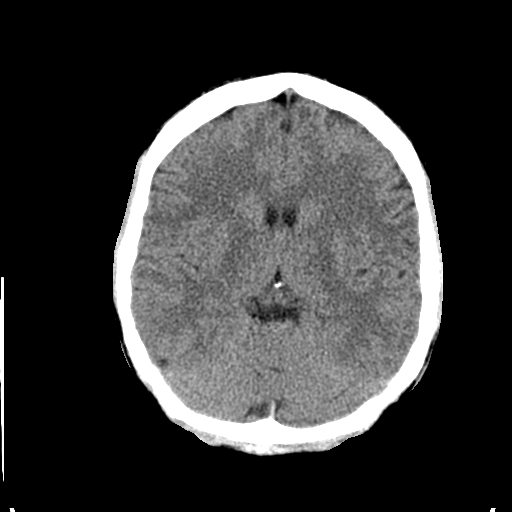
[im 17/32  brain]
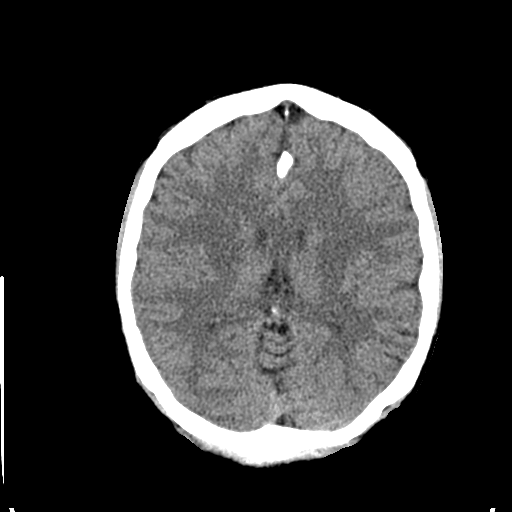
[im 17/32  bone]
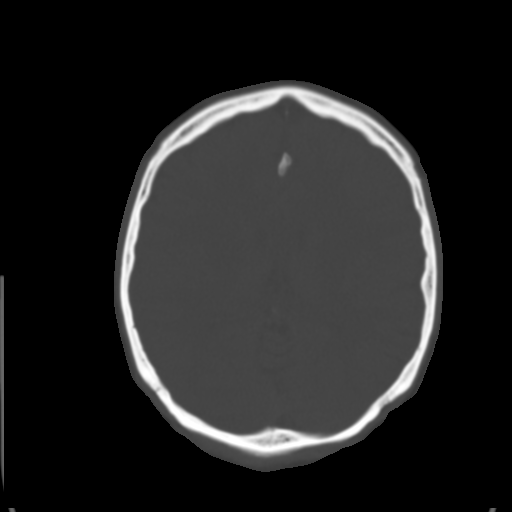
[im 19/32  brain]
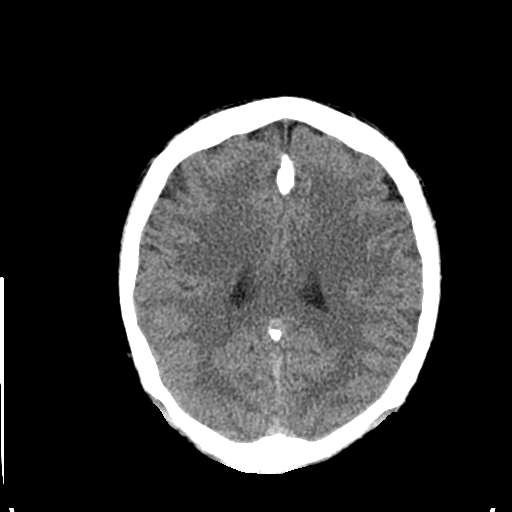
[im 21/32  brain]
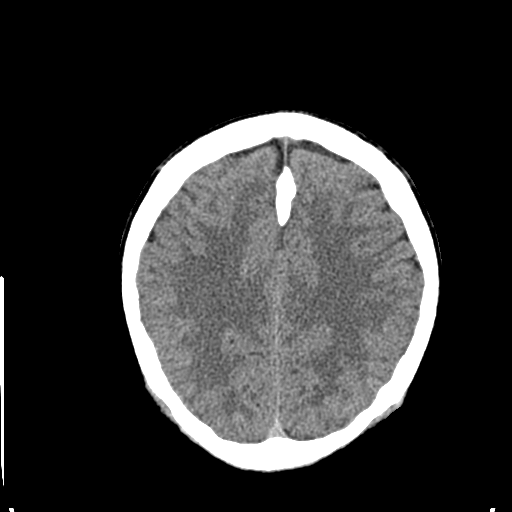
[im 23/32  brain]
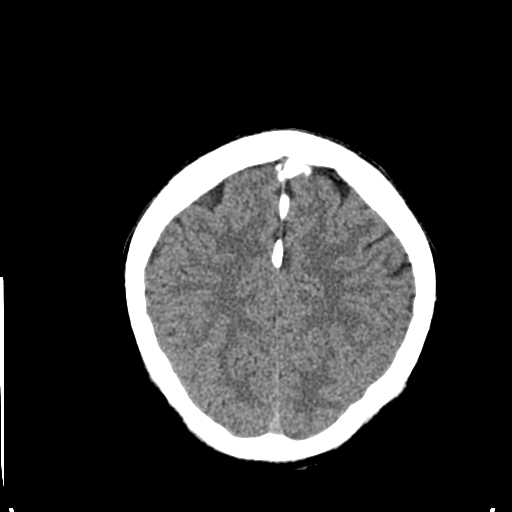
[im 24/32  brain]
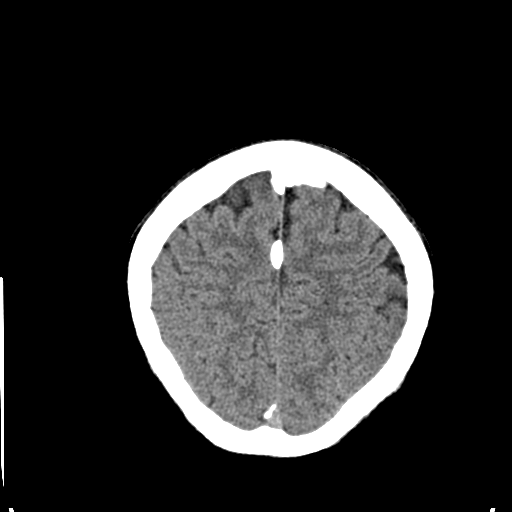
[im 24/32  bone]
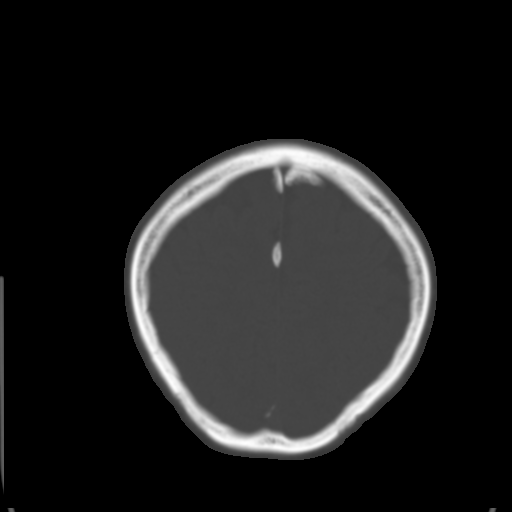
[im 26/32  brain]
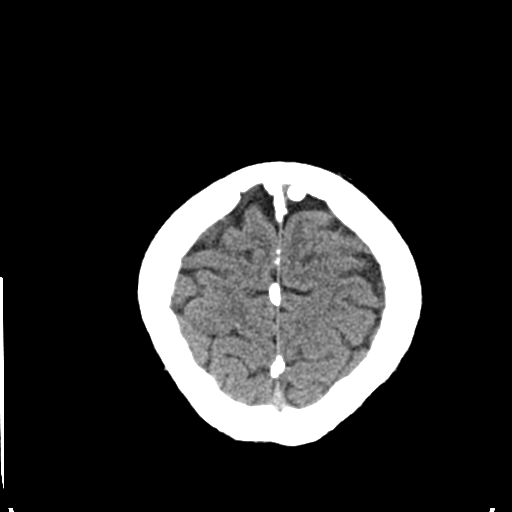
[im 28/32  brain]
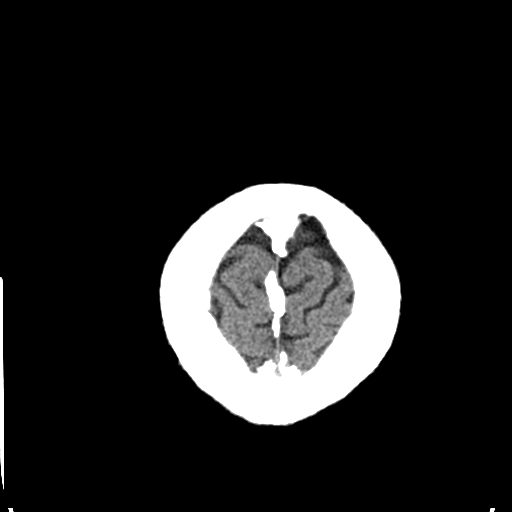
[im 30/32  brain]
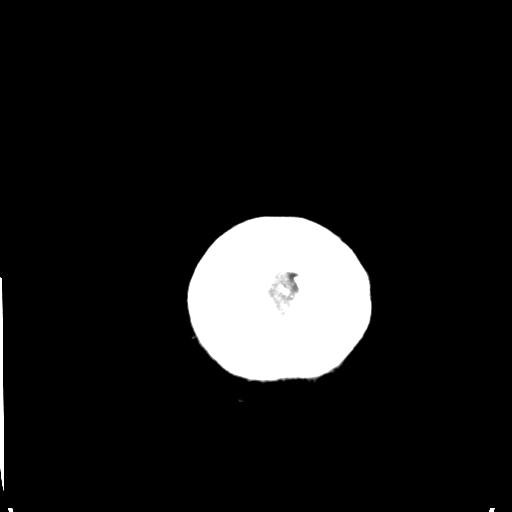

[16 of 30 positions shown; findings below may reference images not displayed]

FINDINGS: Normal appearing cerebral hemispheres and posterior fossa
structures. Normal size and position of the ventricles. No
intracranial hemorrhage, mass lesion or CT evidence of acute
infarction. Unremarkable bones and included paranasal sinuses.
Prominent bilateral dural calcifications.
IMPRESSION: No acute abnormality.
